# Patient Record
Sex: Male | Born: 1975 | Race: Black or African American | Hispanic: No | Marital: Married | Smoking: Former smoker
Health system: Southern US, Community
[De-identification: ages and names within clinical notes are randomized; demographics above are authoritative.]

## PROBLEM LIST (undated history)

## (undated) DIAGNOSIS — B019 Varicella without complication: Secondary | ICD-10-CM

## (undated) DIAGNOSIS — N451 Epididymitis: Secondary | ICD-10-CM

## (undated) DIAGNOSIS — J309 Allergic rhinitis, unspecified: Secondary | ICD-10-CM

## (undated) DIAGNOSIS — I10 Essential (primary) hypertension: Secondary | ICD-10-CM

## (undated) HISTORY — DX: Varicella without complication: B01.9

## (undated) HISTORY — DX: Allergic rhinitis, unspecified: J30.9

## (undated) HISTORY — DX: Epididymitis: N45.1

---
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
## 2004-03-27 DIAGNOSIS — N451 Epididymitis: Secondary | ICD-10-CM

## 2004-03-27 HISTORY — DX: Epididymitis: N45.1

## 2006-08-27 ENCOUNTER — Ambulatory Visit: Payer: Self-pay | Admitting: Internal Medicine

## 2008-02-19 ENCOUNTER — Emergency Department: Payer: Self-pay | Admitting: Emergency Medicine

## 2009-01-20 ENCOUNTER — Ambulatory Visit: Payer: Self-pay | Admitting: Internal Medicine

## 2010-09-14 ENCOUNTER — Ambulatory Visit: Payer: Self-pay | Admitting: Urology

## 2011-04-17 ENCOUNTER — Emergency Department: Payer: Self-pay | Admitting: Unknown Physician Specialty

## 2011-04-25 ENCOUNTER — Emergency Department: Payer: Self-pay | Admitting: Emergency Medicine

## 2013-03-26 ENCOUNTER — Ambulatory Visit: Payer: Self-pay | Admitting: Adult Health

## 2013-08-15 ENCOUNTER — Ambulatory Visit: Payer: Self-pay | Admitting: Adult Health

## 2013-08-29 ENCOUNTER — Encounter: Payer: Self-pay | Admitting: Adult Health

## 2013-08-29 ENCOUNTER — Encounter (INDEPENDENT_AMBULATORY_CARE_PROVIDER_SITE_OTHER): Payer: Self-pay

## 2013-08-29 ENCOUNTER — Ambulatory Visit (INDEPENDENT_AMBULATORY_CARE_PROVIDER_SITE_OTHER): Payer: Managed Care, Other (non HMO) | Admitting: Adult Health

## 2013-08-29 VITALS — BP 144/80 | HR 80 | Temp 97.9°F | Resp 12 | Ht 68.5 in | Wt 244.8 lb

## 2013-08-29 DIAGNOSIS — R1032 Left lower quadrant pain: Secondary | ICD-10-CM | POA: Insufficient documentation

## 2013-08-29 DIAGNOSIS — Z23 Encounter for immunization: Secondary | ICD-10-CM

## 2013-08-29 DIAGNOSIS — Z0000 Encounter for general adult medical examination without abnormal findings: Secondary | ICD-10-CM | POA: Insufficient documentation

## 2013-08-29 NOTE — Progress Notes (Signed)
Pre visit review using our clinic review tool, if applicable. No additional management support is needed unless otherwise documented below in the visit note. 

## 2013-08-29 NOTE — Assessment & Plan Note (Signed)
Patient given flu vaccine during clinic today

## 2013-08-29 NOTE — Progress Notes (Signed)
Subjective:    Patient ID: Jonathan Oconnor, male    DOB: 1976-02-05, 37 y.o.   MRN: [id number]  HPI  Patient is a pleasant 37 year old male who presents to clinic to establish care. Patient is a poor historian. He reports having transient left groin pain. He describes pain lasting less than 5 minutes. Initially the pain is severe than it becomes dull. He reports he has seen a urologist and has had an ultrasound. Patient does not recall who the urologist is. He reports the urologist office was at [facility] Dr. Achilles Dunk. He also reports having an injury in 2005 when he was moving an engine and a transmission. He reports developing a left swollen testicle where he was seen at [facility]. He is unable to give me additional information other than what is written above. Patient has been recently followed by [facility] medical and prior to that by Dr. Jarold Motto in [place]. I will try to obtain medical records including records from [facility] emergency room between the dates of July 2005 and December 2005. Currently patient is not having pain. He denies having swollen testicles. He denies hernia although his description is consistent with one.    Past Medical History  Diagnosis Date  . Chicken pox      Past Surgical History  Procedure Laterality Date  . Knee surgery Left 1982    surgery for ? infection in his knee     Family History  Problem Relation Age of Onset  . Hypertension Mother   . Cancer Maternal Grandfather     prostate  . Heart disease Maternal Grandfather   . Cancer Paternal Grandfather     prostate  . Stroke Paternal Grandfather   . Diabetes Daughter      History   Social History  . Marital Status: Married    Spouse Name: Tangela    Number of Children: 1  . Years of Education: 12   Occupational History  . [employer] certified accountant   Social History Main Topics  . Smoking status: Former Smoker -- 0.25 packs/day for 10 years    Types: Cigarettes    Quit date: 08/30/2007  .  Smokeless tobacco: Never Used  . Alcohol Use: Yes     Comment: Occasionally   . Drug Use: No  . Sexual Activity: Not on file   Other Topics Concern  . Not on file   Social History Narrative   Jonathan Oconnor grew up in [place]. He is currently living in [place] with his wife [name]), daughter Jonathan Oconnor) and his father-in-law. 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 He enjoys fishing on his spare time.     Review of Systems  Constitutional: Negative.   HENT: Negative.   Eyes: Negative.   Respiratory: Negative.   Cardiovascular: Negative.   Gastrointestinal: Negative.   Endocrine: Negative.   Genitourinary: Negative.  Negative for dysuria, hematuria, scrotal swelling, difficulty urinating and testicular pain.  Musculoskeletal: Negative.   Skin: Negative.   Allergic/Immunologic: Negative.   Neurological: Negative.   Hematological: Negative.   Psychiatric/Behavioral: Negative.        Objective:   Physical Exam  Constitutional: He is oriented to person, place, and time. He appears well-developed and well-nourished. No distress.  HENT:  Head: Normocephalic and atraumatic.  Right Ear: External ear normal.  Left Ear: External ear normal.  Nose: Nose normal.  Mouth/Throat: Oropharynx is clear and moist.  Eyes: Conjunctivae and EOM are normal. Pupils are  equal, round, and reactive to light.  Neck: Normal range of motion. Neck supple. No tracheal deviation present. No thyromegaly present.  Cardiovascular: Normal rate, regular rhythm, normal heart sounds and intact distal pulses.  Exam reveals no gallop and no friction rub.   No murmur heard. Pulmonary/Chest: Effort normal and breath sounds normal. No respiratory distress. He has no wheezes. He has no rales.  Abdominal: Soft. Bowel sounds are normal. He exhibits no distension and no mass. There is no tenderness. There is no rebound and no guarding.  Genitourinary:  No palpable groin masses, lymphadenopathy.    Musculoskeletal: Normal range of motion. He exhibits no edema and no tenderness.  Lymphadenopathy:    He has no cervical adenopathy.  Neurological: He is alert and oriented to person, place, and time. He has normal reflexes. No cranial nerve deficit. Coordination normal.  Skin: Skin is warm and dry.  Psychiatric: He has a normal mood and affect. His behavior is normal. Judgment and thought content normal.    BP 144/80  Pulse 80  Temp(Src) 97.9 F (36.6 C) (Oral)  Resp 12  Ht 5' 8.5" (1.74 m)  Wt 244 lb 12 oz (111.018 kg)  BMI 36.67 kg/m2  SpO2 96%        Assessment & Plan:

## 2013-08-29 NOTE — Assessment & Plan Note (Signed)
Normal physical exam. Check labs: CBC with differential, comprehensive metabolic panel, lipids. Obtain previous medical records and testing pertaining to his reported injury in 2005. Also request previous medical records from previous PCPs.

## 2013-08-29 NOTE — Assessment & Plan Note (Addendum)
Currently not in any pain. No swelling of testicle. I will try to obtain medical records from [facility] visit reported above as well as records from urologist. From patient's description I believe he saw Dr. Achilles Dunk whose office was at [facility] during that time. Once I receive the records we will determine additional testing and followup necessary to evaluate ongoing transient pain. Note greater than 45 minutes were spent in face-to-face communication with patient in trying to obtain an accurate H&P, assessment, evaluation, implementation and plan pertaining to the problem.

## 2013-08-29 NOTE — Patient Instructions (Signed)
   Thank you for choosing [place] [facility] for your health care needs.  Please have your labs drawn at your earliest convenience. You will need to be fasting - nothing to eat or drink (except water) after midnight the night before.  The results will be available through MyChart for your convenience. Please remember to activate this. The activation code is located at the end of this form.  I am requesting medical records from your previous providers so that I can get a better understanding of the testing and diagnosis regarding your left groin pain.

## 2013-09-03 ENCOUNTER — Other Ambulatory Visit: Payer: Managed Care, Other (non HMO)

## 2013-09-05 ENCOUNTER — Other Ambulatory Visit (INDEPENDENT_AMBULATORY_CARE_PROVIDER_SITE_OTHER): Payer: Managed Care, Other (non HMO)

## 2013-09-05 ENCOUNTER — Encounter: Payer: Self-pay | Admitting: *Deleted

## 2013-09-05 DIAGNOSIS — Z0000 Encounter for general adult medical examination without abnormal findings: Secondary | ICD-10-CM

## 2013-09-05 LAB — COMPREHENSIVE METABOLIC PANEL
ALK PHOS: 74 U/L (ref 39–117)
ALT: 28 U/L (ref 0–53)
AST: 23 U/L (ref 0–37)
Albumin: 4 g/dL (ref 3.5–5.2)
BILIRUBIN TOTAL: 1.2 mg/dL (ref 0.3–1.2)
BUN: 18 mg/dL (ref 6–23)
CO2: 28 mEq/L (ref 19–32)
Calcium: 8.6 mg/dL (ref 8.4–10.5)
Chloride: 107 mEq/L (ref 96–112)
Creatinine, Ser: 0.8 mg/dL (ref 0.4–1.5)
GFR: 140.75 mL/min (ref >60.00)
GLUCOSE: 101 mg/dL — AB (ref 70–99)
Potassium: 3.9 mEq/L (ref 3.5–5.1)
Sodium: 140 mEq/L (ref 135–145)
Total Protein: 7 g/dL (ref 6.0–8.3)

## 2013-09-05 LAB — CBC WITH DIFFERENTIAL/PLATELET
BASOS PCT: 0.7 % (ref 0.0–3.0)
Basophils Absolute: 0 10*3/uL (ref 0.0–0.1)
Eosinophils Absolute: 0.1 10*3/uL (ref 0.0–0.7)
Eosinophils Relative: 2.5 % (ref 0.0–5.0)
HCT: 41.1 % (ref 39.0–52.0)
HEMOGLOBIN: 14 g/dL (ref 13.0–17.0)
Lymphocytes Relative: 39 % (ref 12.0–46.0)
Lymphs Abs: 1.8 10*3/uL (ref 0.7–4.0)
MCHC: 34 g/dL (ref 30.0–36.0)
MCV: 83.8 fl (ref 78.0–100.0)
MONOS PCT: 7.9 % (ref 3.0–12.0)
Monocytes Absolute: 0.4 10*3/uL (ref 0.1–1.0)
NEUTROS ABS: 2.3 10*3/uL (ref 1.4–7.7)
Neutrophils Relative %: 49.9 % (ref 43.0–77.0)
Platelets: 153 10*3/uL (ref 150.0–400.0)
RBC: 4.9 Mil/uL (ref 4.22–5.81)
RDW: 13.3 % (ref 11.5–14.6)
WBC: 4.5 10*3/uL (ref 4.5–10.5)

## 2013-09-05 LAB — LIPID PANEL
CHOL/HDL RATIO: 3
Cholesterol: 178 mg/dL (ref 0–200)
HDL: 52.1 mg/dL (ref >39.00)
LDL CALC: 110 mg/dL — AB (ref 0–99)
Triglycerides: 79 mg/dL (ref 0.0–149.0)
VLDL: 15.8 mg/dL (ref 0.0–40.0)

## 2013-09-05 LAB — TSH: TSH: 0.72 u[IU]/mL (ref 0.35–5.50)

## 2013-09-07 ENCOUNTER — Encounter: Payer: Self-pay | Admitting: Adult Health

## 2013-09-22 ENCOUNTER — Telehealth: Payer: Self-pay | Admitting: Emergency Medicine

## 2013-09-22 ENCOUNTER — Other Ambulatory Visit: Payer: Self-pay | Admitting: Adult Health

## 2013-09-22 NOTE — Telephone Encounter (Signed)
Dr. Wynn Maudlinope's notes received today, given to Raquel

## 2013-09-22 NOTE — Telephone Encounter (Signed)
Pt wife in office wondering about a MRI?? Please advise

## 2013-09-22 NOTE — Telephone Encounter (Signed)
Spoke with pt's wife, was following up from his first visit appointment, states Raquel was going to review his records and them send him for additional imaging for his groin pain.

## 2013-09-24 ENCOUNTER — Other Ambulatory Visit: Payer: Self-pay | Admitting: Adult Health

## 2013-09-24 DIAGNOSIS — R1032 Left lower quadrant pain: Secondary | ICD-10-CM

## 2013-09-24 NOTE — Telephone Encounter (Signed)
Order a CT scan to evaluate the area.

## 2013-10-01 ENCOUNTER — Ambulatory Visit: Payer: Self-pay | Admitting: Adult Health

## 2013-10-13 ENCOUNTER — Telehealth: Payer: Self-pay | Admitting: Adult Health

## 2013-10-13 NOTE — Telephone Encounter (Signed)
The patient's wife stated that the patient was to be referred to a surgeon for his two hernia's. I don't see a referral in the system for a surgeon.

## 2013-10-13 NOTE — Telephone Encounter (Signed)
Left message, notifying that Jonathan Oconnor was waiting to hear back if ok with referral and if he has a preference.

## 2013-10-17 ENCOUNTER — Other Ambulatory Visit: Payer: Self-pay | Admitting: Adult Health

## 2013-10-17 DIAGNOSIS — K4090 Unilateral inguinal hernia, without obstruction or gangrene, not specified as recurrent: Secondary | ICD-10-CM

## 2013-10-17 NOTE — Telephone Encounter (Signed)
Referral to Dr. Evette CristalSankar made

## 2013-10-17 NOTE — Telephone Encounter (Signed)
Spoke with pt's wife, pt agreeable to surgery referral, prefers Dr. Evette CristalSankar. Advised once referral placed and an appointment scheduled, our patient care coordinator would give him a call with an appointment.

## 2013-10-22 ENCOUNTER — Ambulatory Visit: Payer: Self-pay | Admitting: General Surgery

## 2013-10-28 ENCOUNTER — Encounter: Payer: Self-pay | Admitting: Adult Health

## 2013-11-17 ENCOUNTER — Ambulatory Visit: Payer: Self-pay | Admitting: General Surgery

## 2013-12-03 ENCOUNTER — Encounter: Payer: Self-pay | Admitting: *Deleted

## 2016-04-07 ENCOUNTER — Ambulatory Visit
Admission: RE | Admit: 2016-04-07 | Discharge: 2016-04-07 | Disposition: A | Discharge status: Home / Self Care | Payer: Managed Care, Other (non HMO) | Source: Ambulatory Visit | Attending: Physician Assistant | Admitting: Physician Assistant

## 2016-04-07 ENCOUNTER — Other Ambulatory Visit: Payer: Self-pay | Admitting: Physician Assistant

## 2016-04-07 DIAGNOSIS — M25511 Pain in right shoulder: Secondary | ICD-10-CM | POA: Diagnosis not present

## 2016-04-07 DIAGNOSIS — R52 Pain, unspecified: Secondary | ICD-10-CM

## 2017-04-30 ENCOUNTER — Emergency Department
Admission: EM | Admit: 2017-04-30 | Discharge: 2017-04-30 | Disposition: A | Discharge status: Home / Self Care | Payer: Commercial Managed Care - PPO | Attending: Emergency Medicine | Admitting: Emergency Medicine

## 2017-04-30 DIAGNOSIS — F17210 Nicotine dependence, cigarettes, uncomplicated: Secondary | ICD-10-CM | POA: Diagnosis not present

## 2017-04-30 DIAGNOSIS — I10 Essential (primary) hypertension: Secondary | ICD-10-CM | POA: Diagnosis present

## 2017-04-30 DIAGNOSIS — R519 Headache, unspecified: Secondary | ICD-10-CM

## 2017-04-30 DIAGNOSIS — E876 Hypokalemia: Secondary | ICD-10-CM | POA: Insufficient documentation

## 2017-04-30 DIAGNOSIS — R51 Headache: Secondary | ICD-10-CM | POA: Insufficient documentation

## 2017-04-30 HISTORY — DX: Essential (primary) hypertension: I10

## 2017-04-30 LAB — CBC
HEMATOCRIT: 41.6 % (ref 40.0–52.0)
Hemoglobin: 14.6 g/dL (ref 13.0–18.0)
MCH: 28.5 pg (ref 26.0–34.0)
MCHC: 35.2 g/dL (ref 32.0–36.0)
MCV: 81.1 fL (ref 80.0–100.0)
Platelets: 177 10*3/uL (ref 150–440)
RBC: 5.12 MIL/uL (ref 4.40–5.90)
RDW: 13.7 % (ref 11.5–14.5)
WBC: 5.6 10*3/uL (ref 3.8–10.6)

## 2017-04-30 LAB — TROPONIN I: Troponin I: <0.03 ng/mL (ref <0.03)

## 2017-04-30 LAB — BASIC METABOLIC PANEL
ANION GAP: 11 (ref 5–15)
BUN: 17 mg/dL (ref 6–20)
CALCIUM: 9.6 mg/dL (ref 8.9–10.3)
CO2: 26 mmol/L (ref 22–32)
Chloride: 99 mmol/L — ABNORMAL LOW (ref 101–111)
Creatinine, Ser: 1.12 mg/dL (ref 0.61–1.24)
GLUCOSE: 159 mg/dL — AB (ref 65–99)
POTASSIUM: 3 mmol/L — AB (ref 3.5–5.1)
SODIUM: 136 mmol/L (ref 135–145)

## 2017-04-30 MED ORDER — POTASSIUM CHLORIDE CRYS ER 20 MEQ PO TBCR
40.0000 meq | EXTENDED_RELEASE_TABLET | Freq: Once | ORAL | Status: AC
  Administered 2017-04-30: 40 meq via ORAL
  Filled 2017-04-30: qty 2

## 2017-04-30 MED ORDER — POTASSIUM CHLORIDE CRYS ER 20 MEQ PO TBCR: 20.0000 meq | EXTENDED_RELEASE_TABLET | Freq: Every day | ORAL | 0 refills | Status: DC

## 2017-04-30 MED ORDER — AMLODIPINE BESYLATE 5 MG PO TABS: 5.0000 mg | ORAL_TABLET | Freq: Every day | ORAL | 1 refills | Status: DC

## 2017-04-30 NOTE — ED Provider Notes (Signed)
[facility] Emergency Department Provider Note  ____________________________________________   First MD Initiated Contact with Patient 04/30/17 2147     (approximate)  I have reviewed the triage vital signs and the nursing notes.   HISTORY  Chief Complaint Headache and Hypertension    HPI Jonathan Oconnor is a 41 y.o. male with a history of obesity and hypertension who presents for evaluation of persistent headache for about 3 or 4 days and elevated blood pressure.  He admits that he does not take his blood pressure medication regularly, only "when I think about it."  He reports he is less likely to take it when he is stressed and busy at work, which has definitely been the case recently, but he knows that is also when his blood pressure is higher.  he reports he may have had some mild blurry vision but is not certain.  He became a little bit more concerned yesterday when his headache was the most severe and he also had what he thought was a little bit of left-sided chest pain that was very brief and has completely resolved.  His wife finally convinced him to come to the emergency department today.  He denies fever/chills, difficulty breathing, abdominal pain, nausea, vomiting, constipation, diarrhea, and denies any numbness or weakness in any of his extremities.  he describes his headache is a dull throbbing throughout his whole head.  Light may make it a little bit worse but he has not had any aura around lights and has not had any nausea.  He has no history of migraines. He has not had any recent trauma.   Past Medical History:  Diagnosis Date  . Chicken pox   . Epididymitis 03/27/04   ED visit [facility]  . Hypertension     Patient Active Problem List   Diagnosis Date Noted  . Routine general medical examination at a health care facility 08/29/2013  . Left groin pain 08/29/2013  . Need for prophylactic vaccination and inoculation against influenza  08/29/2013    Past Surgical History:  Procedure Laterality Date  . KNEE SURGERY Left 1982   surgery for ? infection in his knee    Prior to Admission medications   Medication Sig Start Date End Date Taking? Authorizing Provider  amLODipine (NORVASC) 5 MG tablet Take 1 tablet (5 mg total) by mouth daily. 04/30/17 04/30/18  Loleta Rose, MD  potassium chloride SA (KLOR-CON M20) 20 MEQ tablet Take 1 tablet (20 mEq total) by mouth daily. 04/30/17   Loleta Rose, MD    Allergies Patient has no known allergies.  Family History  Problem Relation Age of Onset  . Hypertension Mother   . Cancer Maternal Grandfather        prostate  . Heart disease Maternal Grandfather   . Cancer Paternal Grandfather        prostate  . Stroke Paternal Grandfather   . Diabetes Daughter     Social History Social History  Substance Use Topics  . Smoking status: Current Some Day Smoker    Packs/day: 0.25    Years: 10.00    Types: Cigarettes    Last attempt to quit: 08/30/2007  . Smokeless tobacco: Never Used  . Alcohol use Yes     Comment: Occasionally     Review of Systems Constitutional: No fever/chills Eyes: possible blurry vision yesterday ENT: No sore throat. Cardiovascular: one episode of chest pain yesterday Respiratory: Denies shortness of breath. Gastrointestinal: No abdominal pain.  No nausea,  no vomiting.  No diarrhea.  No constipation. Genitourinary: Negative for dysuria. Musculoskeletal: Negative for neck pain.  Negative for back pain. Integumentary: Negative for rash. Neurological: persistent dull throbbing headache for several days.  No numbness nor weakness in his extremities  ____________________________________________   PHYSICAL EXAM:  VITAL SIGNS: ED Triage Vitals [04/30/17 1912]  Enc Vitals Group     BP (!) 171/107     Pulse Rate 97     Resp 16     Temp 98.5 F (36.9 C)     Temp Source Oral     SpO2 97 %     Weight 120.2 kg (265 lb)     Height 1.753 m (5'  9")     Head Circumference      Peak Flow      Pain Score 7     Pain Loc      Pain Edu?      Excl. in GC?     Constitutional: Alert and oriented. Well appearing and in no acute distress. Eyes: Conjunctivae are normal. PERRL.  No papilledema on funduscopic exam Head: Atraumatic. Nose: No congestion/rhinnorhea. Mouth/Throat: Mucous membranes are moist. Neck: No stridor.  No meningeal signs.   Cardiovascular: Normal rate, regular rhythm. Good peripheral circulation. Grossly normal heart sounds. Respiratory: Normal respiratory effort.  No retractions. Lungs CTAB. Gastrointestinal: obese. Soft and nontender. No distention.  Musculoskeletal: No lower extremity tenderness nor edema. No gross deformities of extremities. Neurologic:  Normal speech and language. No gross focal neurologic deficits are appreciated.  Skin:  Skin is warm, dry and intact. No rash noted. Psychiatric: Mood and affect are normal. Speech and behavior are normal.  ____________________________________________   LABS (all labs ordered are listed, but only abnormal results are displayed)  Labs Reviewed  BASIC METABOLIC PANEL - Abnormal; Notable for the following:       Result Value   Potassium 3.0 (*)    Chloride 99 (*)    Glucose, Bld 159 (*)    All other components within normal limits  CBC  TROPONIN I   ____________________________________________  EKG  ED ECG REPORT I, Adriyana Greenbaum, the attending physician, personally viewed and interpreted this ECG.  Date: 04/30/2017 EKG Time: 19:24 Rate: 93 Rhythm: Incomplete right bundle-branch block QRS Axis: normal Intervals: normal ST/T Wave abnormalities: Non-specific ST segment / T-wave changes, but no evidence of acute ischemia. Narrative Interpretation: no evidence of acute ischemia  ____________________________________________  RADIOLOGY   No results found.  ____________________________________________   PROCEDURES  Critical Care performed:  No   Procedure(s) performed:   Procedures   ____________________________________________   INITIAL IMPRESSION / ASSESSMENT AND PLAN / ED COURSE  Pertinent labs & imaging results that were available during my care of the patient were reviewed by me and considered in my medical decision making (see chart for details).  I had a long conversation with the patient and his wife about acute versus chronic hypertension, size and symptoms of hypertensive urgency, etc.  We discussed various options and we settled on the following plan: He promises to take his HCTZ daily and they will work together on a healthy eating plan including reducing sodium. He will check his blood pressure again in about a week and see if it is improved.  If not, he will start takig the amlodipine are prescribed in addition to his HCTZ.  He is also going to take potassium supplement given that his potassium was low in spite of intermittent doses ofHCTZ.  All of  the usual intracranial causes of headache were considered but I do not believe he has any acute or emergent cause at this time.  I think he may mostly be suffering from tension headachesbecausehe mentioned multiple times the stress and pressure at work, but his blood pressure may be contributing  I encouraged him to follow up with his regular doctor at the next available opportunity.  I gave my usual and customary return precautions.   He and his wife agree with the plan.      ____________________________________________  FINAL CLINICAL IMPRESSION(S) / ED DIAGNOSES  Final diagnoses:  Essential hypertension  Hypokalemia  Acute nonintractable headache, unspecified headache type     MEDICATIONS GIVEN DURING THIS VISIT:  Medications  potassium chloride SA (K-DUR,KLOR-CON) CR tablet 40 mEq (40 mEq Oral Given 04/30/17 2256)     NEW OUTPATIENT MEDICATIONS STARTED DURING THIS VISIT:  Discharge Medication List as of 04/30/2017 10:46 PM    START taking these  medications   Details  amLODipine (NORVASC) 5 MG tablet Take 1 tablet (5 mg total) by mouth daily., Starting Mon 04/30/2017, Until Tue 04/30/2018, Print    potassium chloride SA (KLOR-CON M20) 20 MEQ tablet Take 1 tablet (20 mEq total) by mouth daily., Starting Mon 04/30/2017, Print        Discharge Medication List as of 04/30/2017 10:46 PM      Discharge Medication List as of 04/30/2017 10:46 PM       Note:  This document was prepared using Dragon voice recognition software and may include unintentional dictation errors.    Loleta Rose, MD 04/30/17 [phone]

## 2017-04-30 NOTE — Discharge Instructions (Signed)
As we discussed, though you do have high blood pressure (hypertension), fortunately it is not immediately dangerous at this time and does not need emergency intervention or admission to the hospital.  We recommend you take your normal blood pressure medication as written on the bottle, and read through the information in this paperwork about the DASH eating plan.  Check your blood pressure again in about a week, and if your pressure is still elevated in spite of making dietary changes and taking your medication, try starting the new medication I prescribed (amlodipine) IN ADDITION to your regular HCTZ.    Because your potassium level is low today, and HCTZ can drop potassium levels even farther, we recommend you take an over-the-counter potassium supplement, or take the one I prescribed.  Follow up with your primary care doctor at the next available opportunity to discuss your symptoms, the changes we recommended, and how you are feeling at the time.  Return to the emergency department if you develop new or worsening symptoms that concern you.

## 2017-04-30 NOTE — ED Triage Notes (Signed)
Pt presents via POV c/o headache and HTN since Friday. Pt reports hx HTN but noncompliant with medication regimen. Pt In NAD in triage. Pt reports some chest pain earlier but denies currently.

## 2018-06-21 IMAGING — CR DG SHOULDER 2+V*R*
1 series · 3 of 3 positions shown · non-contrast
Comparison: None.

CLINICAL DATA: Patient with right shoulder pain for multiple
months. No known injury

EXAM:
RIGHT SHOULDER - 2+ VIEW

[Series 1: dg shoulder right · 0.14mm/px · 3 of 3 slices shown]
[im 1/3]
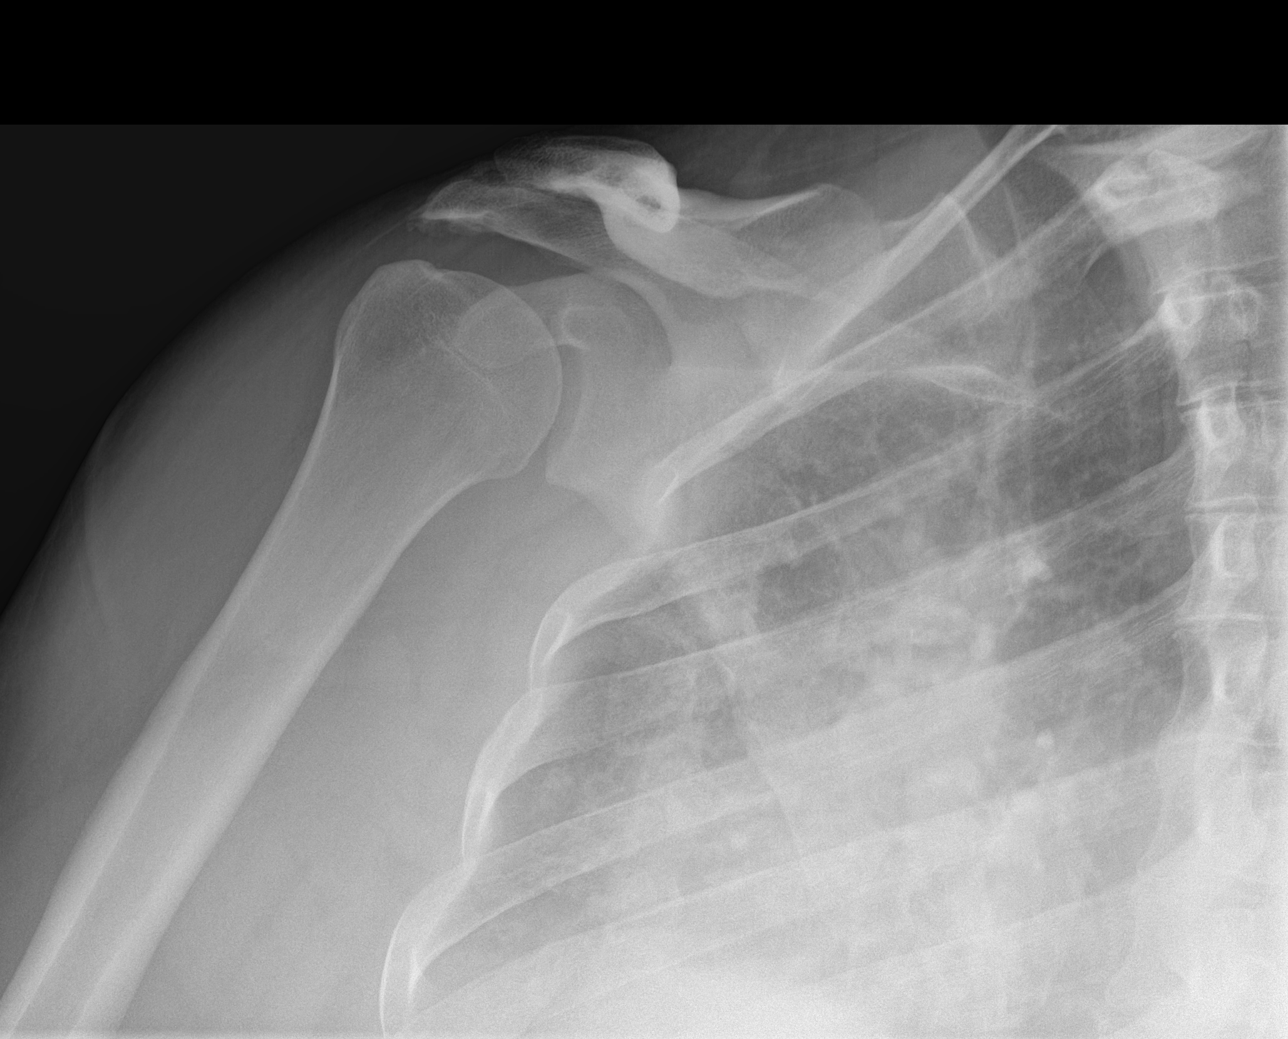
[im 2/3]
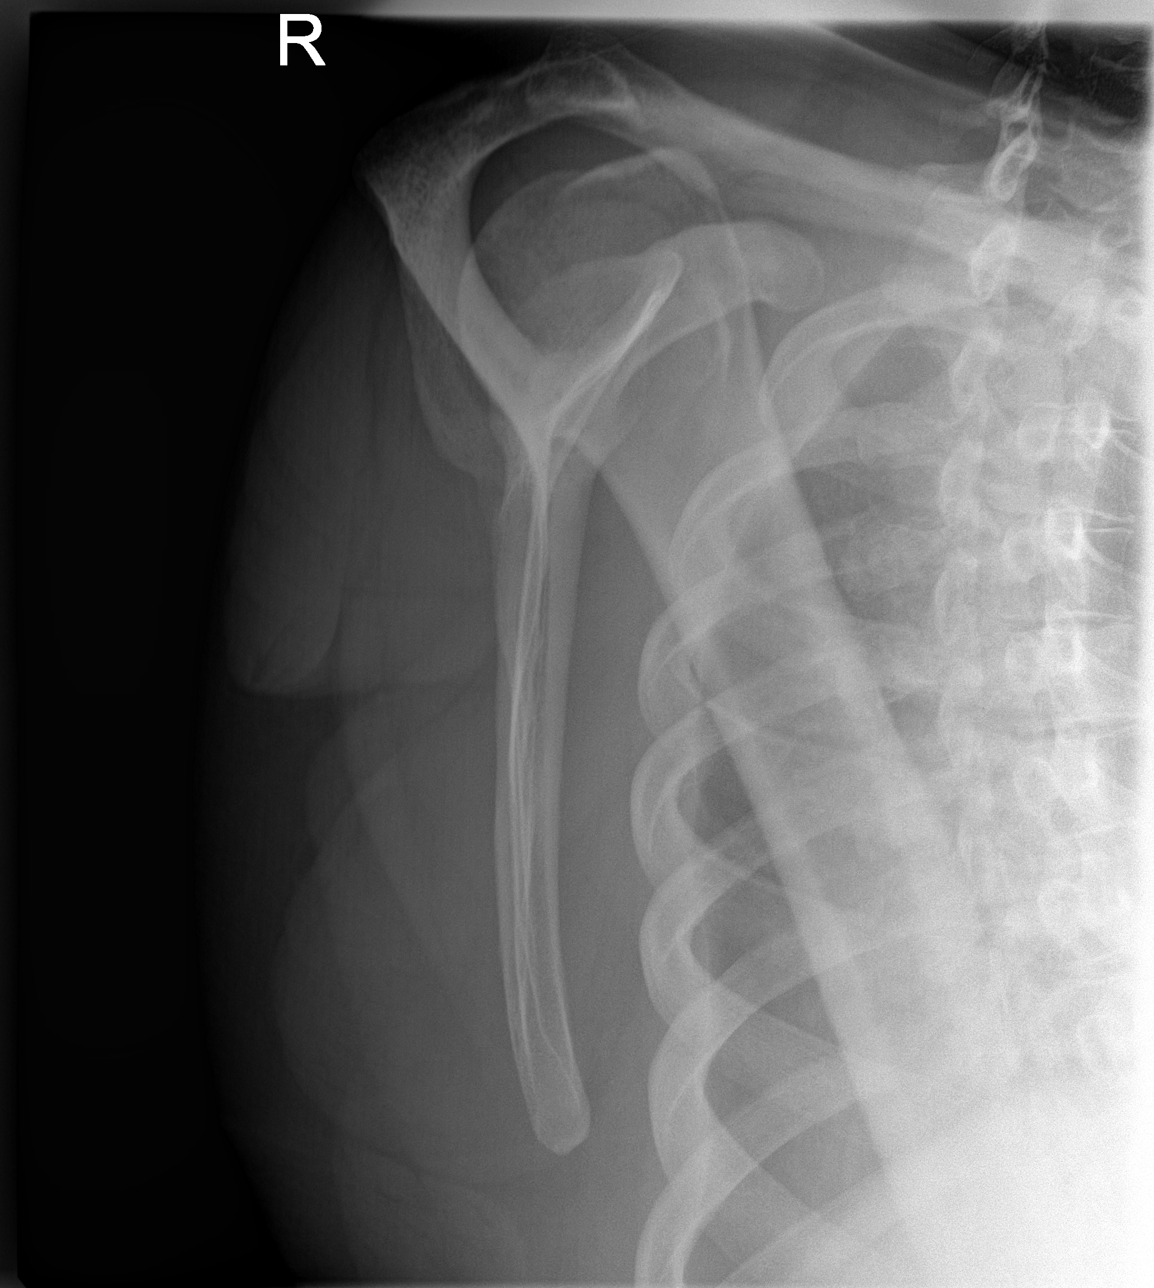
[im 3/3]
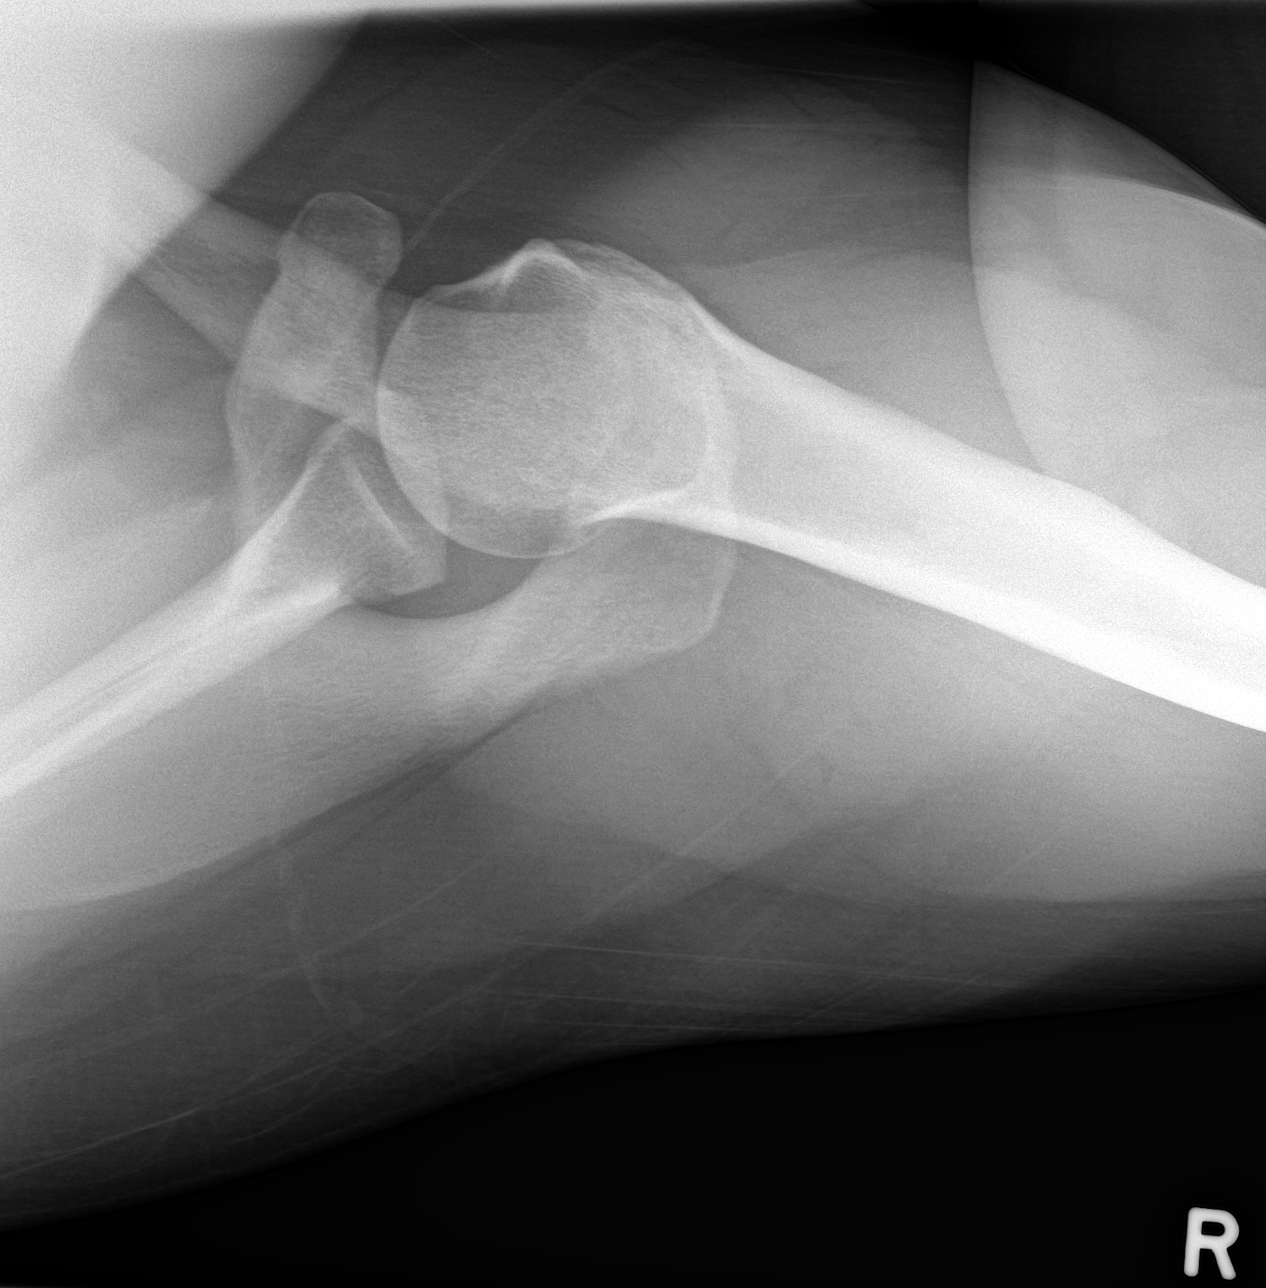

[3 of 3 positions shown; findings below may reference images not displayed]

FINDINGS: Normal anatomic alignment. No evidence for acute fracture or
dislocation. Right hemi thorax is unremarkable.
IMPRESSION: No acute osseous abnormality.

## 2018-07-01 ENCOUNTER — Telehealth: Payer: Self-pay | Admitting: Physician Assistant

## 2018-07-01 NOTE — Telephone Encounter (Signed)
Called to confirm new patient appt on 10/29 and pt needs to reschedule appt. Is there anyway the pt can get back on the schedule in November or December? Or does the pt need to wait until March?

## 2018-07-02 ENCOUNTER — Ambulatory Visit: Payer: Commercial Managed Care - PPO | Admitting: Internal Medicine

## 2018-07-02 NOTE — Telephone Encounter (Signed)
Patient rescheduled appointment to 07/19/18 at 9:30.

## 2018-07-02 NOTE — Telephone Encounter (Signed)
Go ahead and find him something sooner

## 2018-07-19 ENCOUNTER — Ambulatory Visit (INDEPENDENT_AMBULATORY_CARE_PROVIDER_SITE_OTHER): Payer: 59 | Admitting: Internal Medicine

## 2018-07-19 ENCOUNTER — Encounter: Payer: Self-pay | Admitting: Internal Medicine

## 2018-07-19 VITALS — BP 154/96 | HR 68 | Temp 97.8°F | Ht 68.0 in | Wt 248.0 lb

## 2018-07-19 DIAGNOSIS — Z0000 Encounter for general adult medical examination without abnormal findings: Secondary | ICD-10-CM

## 2018-07-19 DIAGNOSIS — Z23 Encounter for immunization: Secondary | ICD-10-CM

## 2018-07-19 DIAGNOSIS — I10 Essential (primary) hypertension: Secondary | ICD-10-CM | POA: Diagnosis not present

## 2018-07-19 MED ORDER — VALSARTAN-HYDROCHLOROTHIAZIDE 160-25 MG PO TABS: 1.0000 | ORAL_TABLET | Freq: Every day | ORAL | 3 refills | Status: DC

## 2018-07-19 NOTE — Progress Notes (Signed)
Subjective:    Patient ID: Jonathan Oconnor, male    DOB: 03/01/1976, 42 y.o.   MRN: [id number]  HPI Here to establish care Had switched to [place] --was unsatisfied  Known HTN Goes back about 2 years ago Was on HCTZ and it got better Wound up with a lot of stress at job at [employer] to ER and was put on amlodipine He ran out of this Found the HCTZ better  Goes to holistic trainer with wife Works on [employer] to gym  Has some pain in his hands at times Usually after work but not always More in wrists and down forearms No symptoms in bed Discussed early carpal tunnel  Current Outpatient Medications on File Prior to Visit  Medication Sig Dispense Refill  . potassium chloride SA (KLOR-CON M20) 20 MEQ tablet Take 1 tablet (20 mEq total) by mouth daily. 14 tablet 0  . amLODipine (NORVASC) 5 MG tablet Take 1 tablet (5 mg total) by mouth daily. 30 tablet 1   No current facility-administered medications on file prior to visit.     Allergies  Allergen Reactions  . Avocado Other (See Comments)    Sore throat  . Other Nausea And Vomiting    Melons    Past Medical History:  Diagnosis Date  . Chicken pox   . Epididymitis 03/27/04   ED visit [facility]  . Hypertension     Past Surgical History:  Procedure Laterality Date  . KNEE SURGERY Left 1982   surgery for ? infection in his knee    Family History  Problem Relation Age of Onset  . Hypertension Mother   . Diabetes Mother        prediabetes  . Cancer Maternal Grandfather        prostate  . Heart disease Maternal Grandfather   . Cancer Paternal Grandfather        prostate  . Stroke Paternal Grandfather   . Prostate cancer Father     Social History   Socioeconomic History  . Marital status: Married    Spouse name: Tangela  . Number of children: 2  . Years of education: 24  . Highest education level: Not on file  Occupational History  . Occupation: Glass blower/designer: [employer]    Comment: Technical brewer  Social Needs  . Financial resource strain: Not on file  . Food insecurity:    Worry: Not on file    Inability: Not on file  . Transportation needs:    Medical: Not on file    Non-medical: Not on file  Tobacco Use  . Smoking status: Former Smoker    Packs/day: 0.25    Years: 10.00    Pack years: 2.50    Types: Cigarettes    Last attempt to quit: 2018    Years since quitting: 1.8  . Smokeless tobacco: Never Used  Substance and Sexual Activity  . Alcohol use: Yes    Comment: Occasionally   . Drug use: No  . Sexual activity: Not on file  Lifestyle  . Physical activity:    Days per week: Not on file    Minutes per session: Not on file  . Stress: Not on file  Relationships  . Social connections:    Talks on phone: Not on file    Gets together: Not on file    Attends religious service: Not on file    Active member of club or organization: Not on file  Attends meetings of clubs or organizations: Not on file    Relationship status: Not on file  . Intimate partner violence:    Fear of current or ex partner: Not on file    Emotionally abused: Not on file    Physically abused: Not on file    Forced sexual activity: Not on file  Other Topics Concern  . Not on file  Social History Narrative   Minerva Areolaric grew up in [place]. He is currently living in [place] with his wife [name](Tangela), daughter [name](Qiara) and his father-in-law. Step daughter --with his grandchild. He enjoys fishing on his spare time.    Review of Systems  Constitutional: Negative for fatigue and unexpected weight change.       Wears seat belt Occ smokes CBD leaf---counseled  HENT: Positive for tinnitus. Negative for dental problem, hearing loss and trouble swallowing.        Keeps up with dentist  Eyes: Negative for visual disturbance.       No diplopia or unilateral vision loss  Respiratory: Negative for cough, chest tightness and shortness of breath.   Cardiovascular: Positive for  palpitations. Negative for chest pain and leg swelling.  Gastrointestinal: Negative for blood in stool and constipation.       Occ heartburn-- no meds  Endocrine: Negative for polydipsia and polyuria.  Genitourinary: Negative for difficulty urinating and urgency.       No sexual problems  Musculoskeletal: Positive for back pain. Negative for arthralgias and joint swelling.       Sees chiropractor regularly  Allergic/Immunologic: Positive for environmental allergies. Negative for immunocompromised state.       Zyrtec and flonase at times  Neurological: Positive for headaches. Negative for syncope.       Some dizziness--- ?inner ear  Hematological: Negative for adenopathy. Does not bruise/bleed easily.  Psychiatric/Behavioral: Negative for dysphoric mood and sleep disturbance. The patient is not nervous/anxious.        Objective:   Physical Exam         Assessment & Plan:

## 2018-07-19 NOTE — Patient Instructions (Signed)
DASH Eating Plan DASH stands for "Dietary Approaches to Stop Hypertension." The DASH eating plan is a healthy eating plan that has been shown to reduce high blood pressure (hypertension). It may also reduce your risk for type 2 diabetes, heart disease, and stroke. The DASH eating plan may also help with weight loss. What are tips for following this plan? General guidelines  Avoid eating more than 2,300 mg (milligrams) of salt (sodium) a day. If you have hypertension, you may need to reduce your sodium intake to 1,500 mg a day.  Limit alcohol intake to no more than 1 drink a day for nonpregnant women and 2 drinks a day for men. One drink equals 12 oz of beer, 5 oz of wine, or 1 oz of hard liquor.  Work with your health care provider to maintain a healthy body weight or to lose weight. Ask what an ideal weight is for you.  Get at least 30 minutes of exercise that causes your heart to beat faster (aerobic exercise) most days of the week. Activities may include walking, swimming, or biking.  Work with your health care provider or diet and nutrition specialist (dietitian) to adjust your eating plan to your individual calorie needs. Reading food labels  Check food labels for the amount of sodium per serving. Choose foods with less than 5 percent of the Daily Value of sodium. Generally, foods with less than 300 mg of sodium per serving fit into this eating plan.  To find whole grains, look for the word "whole" as the first word in the ingredient list. Shopping  Buy products labeled as "low-sodium" or "no salt added."  Buy fresh foods. Avoid canned foods and premade or frozen meals. Cooking  Avoid adding salt when cooking. Use salt-free seasonings or herbs instead of table salt or sea salt. Check with your health care provider or pharmacist before using salt substitutes.  Do not fry foods. Cook foods using healthy methods such as baking, boiling, grilling, and broiling instead.  Cook with  heart-healthy oils, such as olive, canola, soybean, or sunflower oil. Meal planning   Eat a balanced diet that includes: ? 5 or more servings of fruits and vegetables each day. At each meal, try to fill half of your plate with fruits and vegetables. ? Up to 6-8 servings of whole grains each day. ? Less than 6 oz of lean meat, poultry, or fish each day. A 3-oz serving of meat is about the same size as a deck of cards. One egg equals 1 oz. ? 2 servings of low-fat dairy each day. ? A serving of nuts, seeds, or beans 5 times each week. ? Heart-healthy fats. Healthy fats called Omega-3 fatty acids are found in foods such as flaxseeds and coldwater fish, like sardines, salmon, and mackerel.  Limit how much you eat of the following: ? Canned or prepackaged foods. ? Food that is high in trans fat, such as fried foods. ? Food that is high in saturated fat, such as fatty meat. ? Sweets, desserts, sugary drinks, and other foods with added sugar. ? Full-fat dairy products.  Do not salt foods before eating.  Try to eat at least 2 vegetarian meals each week.  Eat more home-cooked food and less restaurant, buffet, and fast food.  When eating at a restaurant, ask that your food be prepared with less salt or no salt, if possible. What foods are recommended? The items listed may not be a complete list. Talk with your dietitian about what   dietary choices are best for you. Grains Whole-grain or whole-wheat bread. Whole-grain or whole-wheat pasta. Brown rice. Oatmeal. Quinoa. Bulgur. Whole-grain and low-sodium cereals. Pita bread. Low-fat, low-sodium crackers. Whole-wheat flour tortillas. Vegetables Fresh or frozen vegetables (raw, steamed, roasted, or grilled). Low-sodium or reduced-sodium tomato and vegetable juice. Low-sodium or reduced-sodium tomato sauce and tomato paste. Low-sodium or reduced-sodium canned vegetables. Fruits All fresh, dried, or frozen fruit. Canned fruit in natural juice (without  added sugar). Meat and other protein foods Skinless chicken or turkey. Ground chicken or turkey. Pork with fat trimmed off. Fish and seafood. Egg whites. Dried beans, peas, or lentils. Unsalted nuts, nut butters, and seeds. Unsalted canned beans. Lean cuts of beef with fat trimmed off. Low-sodium, lean deli meat. Dairy Low-fat (1%) or fat-free (skim) milk. Fat-free, low-fat, or reduced-fat cheeses. Nonfat, low-sodium ricotta or cottage cheese. Low-fat or nonfat yogurt. Low-fat, low-sodium cheese. Fats and oils Soft margarine without trans fats. Vegetable oil. Low-fat, reduced-fat, or light mayonnaise and salad dressings (reduced-sodium). Canola, safflower, olive, soybean, and sunflower oils. Avocado. Seasoning and other foods Herbs. Spices. Seasoning mixes without salt. Unsalted popcorn and pretzels. Fat-free sweets. What foods are not recommended? The items listed may not be a complete list. Talk with your dietitian about what dietary choices are best for you. Grains Baked goods made with fat, such as croissants, muffins, or some breads. Dry pasta or rice meal packs. Vegetables Creamed or fried vegetables. Vegetables in a cheese sauce. Regular canned vegetables (not low-sodium or reduced-sodium). Regular canned tomato sauce and paste (not low-sodium or reduced-sodium). Regular tomato and vegetable juice (not low-sodium or reduced-sodium). Pickles. Olives. Fruits Canned fruit in a light or heavy syrup. Fried fruit. Fruit in cream or butter sauce. Meat and other protein foods Fatty cuts of meat. Ribs. Fried meat. Bacon. Sausage. Bologna and other processed lunch meats. Salami. Fatback. Hotdogs. Bratwurst. Salted nuts and seeds. Canned beans with added salt. Canned or smoked fish. Whole eggs or egg yolks. Chicken or turkey with skin. Dairy Whole or 2% milk, cream, and half-and-half. Whole or full-fat cream cheese. Whole-fat or sweetened yogurt. Full-fat cheese. Nondairy creamers. Whipped toppings.  Processed cheese and cheese spreads. Fats and oils Butter. Stick margarine. Lard. Shortening. Ghee. Bacon fat. Tropical oils, such as coconut, palm kernel, or palm oil. Seasoning and other foods Salted popcorn and pretzels. Onion salt, garlic salt, seasoned salt, table salt, and sea salt. Worcestershire sauce. Tartar sauce. Barbecue sauce. Teriyaki sauce. Soy sauce, including reduced-sodium. Steak sauce. Canned and packaged gravies. Fish sauce. Oyster sauce. Cocktail sauce. Horseradish that you find on the shelf. Ketchup. Mustard. Meat flavorings and tenderizers. Bouillon cubes. Hot sauce and Tabasco sauce. Premade or packaged marinades. Premade or packaged taco seasonings. Relishes. Regular salad dressings. Where to find more information:  National Heart, Lung, and Blood Institute: www.nhlbi.nih.gov  American Heart Association: www.heart.org Summary  The DASH eating plan is a healthy eating plan that has been shown to reduce high blood pressure (hypertension). It may also reduce your risk for type 2 diabetes, heart disease, and stroke.  With the DASH eating plan, you should limit salt (sodium) intake to 2,300 mg a day. If you have hypertension, you may need to reduce your sodium intake to 1,500 mg a day.  When on the DASH eating plan, aim to eat more fresh fruits and vegetables, whole grains, lean proteins, low-fat dairy, and heart-healthy fats.  Work with your health care provider or diet and nutrition specialist (dietitian) to adjust your eating plan to your individual   calorie needs. This information is not intended to replace advice given to you by your health care provider. Make sure you discuss any questions you have with your health care provider. Document Released: 08/10/2011 Document Revised: 08/14/2016 Document Reviewed: 08/14/2016 Elsevier Interactive Patient Education  2018 Elsevier Inc.  

## 2018-07-19 NOTE — Assessment & Plan Note (Signed)
Did well with HCTZ--but will restart as combo agent Recheck 1 month

## 2018-07-19 NOTE — Addendum Note (Signed)
Addended by: Eual FinesBRIDGES, Denny Mccree P on: 07/19/2018 10:49 AM   Modules accepted: Orders

## 2018-07-19 NOTE — Assessment & Plan Note (Addendum)
Healthy Discussed DASH diet Tdap today Prefers no flu shot---discussed He does exercise

## 2018-08-29 ENCOUNTER — Ambulatory Visit (INDEPENDENT_AMBULATORY_CARE_PROVIDER_SITE_OTHER): Payer: 59 | Admitting: Internal Medicine

## 2018-08-29 ENCOUNTER — Encounter: Payer: Self-pay | Admitting: Internal Medicine

## 2018-08-29 VITALS — BP 126/88 | HR 104 | Temp 98.4°F | Ht 68.0 in | Wt 251.0 lb

## 2018-08-29 DIAGNOSIS — Z8042 Family history of malignant neoplasm of prostate: Secondary | ICD-10-CM | POA: Diagnosis not present

## 2018-08-29 DIAGNOSIS — I10 Essential (primary) hypertension: Secondary | ICD-10-CM | POA: Diagnosis not present

## 2018-08-29 NOTE — Progress Notes (Signed)
Subjective:    Patient ID: Jonathan Oconnor, male    DOB: 12/18/1975, 42 y.o.   MRN: [id number]  HPI Here for follow up of HTN No problems with the new medication  He doesn't check his BP No chest pain or SOB No dizziness or syncope No edema  Trying to eat healthier  Current Outpatient Medications on File Prior to Visit  Medication Sig Dispense Refill  . cetirizine (ZYRTEC) 10 MG tablet Take 10 mg by mouth daily as needed for allergies.    . fluticasone (FLONASE) 50 MCG/ACT nasal spray Place 2 sprays into both nostrils daily as needed for allergies or rhinitis.    . valsartan-hydrochlorothiazide (DIOVAN-HCT) 160-25 MG tablet Take 1 tablet by mouth daily. 90 tablet 3   No current facility-administered medications on file prior to visit.     Allergies  Allergen Reactions  . Avocado Other (See Comments)    Sore throat  . Other Nausea And Vomiting    Melons    Past Medical History:  Diagnosis Date  . Allergic rhinitis   . Chicken pox   . Epididymitis 03/27/04   [facility]  . Hypertension     Past Surgical History:  Procedure Laterality Date  . KNEE SURGERY Left 1982   surgery for ? infection in his knee    Family History  Problem Relation Age of Onset  . Hypertension Mother   . Diabetes Mother        prediabetes  . Cancer Maternal Grandfather        prostate  . Heart disease Maternal Grandfather   . Cancer Paternal Grandfather        prostate  . Stroke Paternal Grandfather   . Prostate cancer Father     Social History   Socioeconomic History  . Marital status: Married    Spouse name: Tangela  . Number of children: 2  . Years of education: 6112  . Highest education level: Not on file  Occupational History  . Occupation: Glass blower/designerAssembly    Employer: [employer]    Comment: Technical brewerrecor  Social Needs  . Financial resource strain: Not on file  . Food insecurity:    Worry: Not on file    Inability: Not on file  . Transportation needs:   Medical: Not on file    Non-medical: Not on file  Tobacco Use  . Smoking status: Former Smoker    Packs/day: 0.25    Years: 10.00    Pack years: 2.50    Types: Cigarettes    Last attempt to quit: 2018    Years since quitting: 1.9  . Smokeless tobacco: Never Used  Substance and Sexual Activity  . Alcohol use: Yes    Comment: Occasionally   . Drug use: No  . Sexual activity: Not on file  Lifestyle  . Physical activity:    Days per week: Not on file    Minutes per session: Not on file  . Stress: Not on file  Relationships  . Social connections:    Talks on phone: Not on file    Gets together: Not on file    Attends religious service: Not on file    Active member of club or organization: Not on file    Attends meetings of clubs or organizations: Not on file    Relationship status: Not on file  . Intimate partner violence:    Fear of current or ex partner: Not on file    Emotionally abused:  Not on file    Physically abused: Not on file    Forced sexual activity: Not on file  Other Topics Concern  . Not on file  Social History Narrative   Minerva Areolaric grew [place]. He is currently living in [place] with his wife [name](Tangela), daughter [name](Qiara) and his father-in-law. Step daughter --with his grandchild. He enjoys fishing on his spare time.    Review of Systems No edema Sleeps well Using copper fit gloves--helping his hands/wrists Requests PSA ---dad had prostate cancer (and they have wellness policy)    Objective:   Physical Exam  Constitutional: He appears well-developed. No distress.  Neck: No thyromegaly present.  Cardiovascular: Normal rate, regular rhythm and normal heart sounds. Exam reveals no gallop.  No murmur heard. Respiratory: Effort normal and breath sounds normal. No respiratory distress. He has no wheezes. He has no rales.  Musculoskeletal:        General: No edema.  Lymphadenopathy:    He has no cervical adenopathy.  Psychiatric: He has a normal  mood and affect. His behavior is normal.           Assessment & Plan:

## 2018-08-29 NOTE — Assessment & Plan Note (Signed)
BP Readings from Last 3 Encounters:  08/29/18 126/88  07/19/18 (!) 154/96  04/30/17 (!) 154/107   Good control now No problems with the medication Will check labs

## 2018-08-30 LAB — COMPREHENSIVE METABOLIC PANEL
ALBUMIN: 4.7 g/dL (ref 3.5–5.2)
ALK PHOS: 68 U/L (ref 39–117)
ALT: 19 U/L (ref 0–53)
AST: 19 U/L (ref 0–37)
BUN: 16 mg/dL (ref 6–23)
CHLORIDE: 99 meq/L (ref 96–112)
CO2: 29 mEq/L (ref 19–32)
CREATININE: 0.99 mg/dL (ref 0.40–1.50)
Calcium: 9.9 mg/dL (ref 8.4–10.5)
GFR: 107.2 mL/min (ref >60.00)
GLUCOSE: 89 mg/dL (ref 70–99)
POTASSIUM: 3.7 meq/L (ref 3.5–5.1)
SODIUM: 139 meq/L (ref 135–145)
TOTAL PROTEIN: 7.8 g/dL (ref 6.0–8.3)
Total Bilirubin: 1.1 mg/dL (ref 0.2–1.2)

## 2018-08-30 LAB — CBC
HEMATOCRIT: 42.4 % (ref 39.0–52.0)
Hemoglobin: 14.6 g/dL (ref 13.0–17.0)
MCHC: 34.6 g/dL (ref 30.0–36.0)
MCV: 82.3 fl (ref 78.0–100.0)
Platelets: 198 10*3/uL (ref 150.0–400.0)
RBC: 5.15 Mil/uL (ref 4.22–5.81)
RDW: 13 % (ref 11.5–15.5)
WBC: 6.1 10*3/uL (ref 4.0–10.5)

## 2018-08-30 LAB — PSA: PSA: 0.63 ng/mL (ref 0.10–4.00)

## 2018-10-07 DIAGNOSIS — M9901 Segmental and somatic dysfunction of cervical region: Secondary | ICD-10-CM | POA: Diagnosis not present

## 2018-10-07 DIAGNOSIS — M9902 Segmental and somatic dysfunction of thoracic region: Secondary | ICD-10-CM | POA: Diagnosis not present

## 2018-10-07 DIAGNOSIS — M531 Cervicobrachial syndrome: Secondary | ICD-10-CM | POA: Diagnosis not present

## 2018-11-04 DIAGNOSIS — M9902 Segmental and somatic dysfunction of thoracic region: Secondary | ICD-10-CM | POA: Diagnosis not present

## 2018-11-04 DIAGNOSIS — M9901 Segmental and somatic dysfunction of cervical region: Secondary | ICD-10-CM | POA: Diagnosis not present

## 2018-11-04 DIAGNOSIS — M531 Cervicobrachial syndrome: Secondary | ICD-10-CM | POA: Diagnosis not present

## 2018-12-11 DIAGNOSIS — M9902 Segmental and somatic dysfunction of thoracic region: Secondary | ICD-10-CM | POA: Diagnosis not present

## 2018-12-11 DIAGNOSIS — M9901 Segmental and somatic dysfunction of cervical region: Secondary | ICD-10-CM | POA: Diagnosis not present

## 2018-12-11 DIAGNOSIS — M531 Cervicobrachial syndrome: Secondary | ICD-10-CM | POA: Diagnosis not present

## 2018-12-11 MED ORDER — CETIRIZINE HCL 10 MG PO TABS: 10.0000 mg | ORAL_TABLET | Freq: Every day | ORAL | 11 refills | Status: DC | PRN

## 2019-01-23 DIAGNOSIS — M9901 Segmental and somatic dysfunction of cervical region: Secondary | ICD-10-CM | POA: Diagnosis not present

## 2019-01-23 DIAGNOSIS — M531 Cervicobrachial syndrome: Secondary | ICD-10-CM | POA: Diagnosis not present

## 2019-01-23 DIAGNOSIS — M9902 Segmental and somatic dysfunction of thoracic region: Secondary | ICD-10-CM | POA: Diagnosis not present

## 2019-09-01 ENCOUNTER — Encounter: Payer: 59 | Admitting: Internal Medicine

## 2019-10-10 ENCOUNTER — Encounter: Payer: Self-pay | Admitting: Internal Medicine

## 2019-10-10 ENCOUNTER — Ambulatory Visit (INDEPENDENT_AMBULATORY_CARE_PROVIDER_SITE_OTHER): Payer: Managed Care, Other (non HMO) | Admitting: Internal Medicine

## 2019-10-10 ENCOUNTER — Other Ambulatory Visit: Payer: Self-pay

## 2019-10-10 VITALS — BP 138/90 | HR 81 | Temp 97.1°F | Ht 68.0 in | Wt 272.0 lb

## 2019-10-10 DIAGNOSIS — J301 Allergic rhinitis due to pollen: Secondary | ICD-10-CM

## 2019-10-10 DIAGNOSIS — I10 Essential (primary) hypertension: Secondary | ICD-10-CM

## 2019-10-10 DIAGNOSIS — Z0000 Encounter for general adult medical examination without abnormal findings: Secondary | ICD-10-CM | POA: Diagnosis not present

## 2019-10-10 LAB — COMPREHENSIVE METABOLIC PANEL
ALT: 17 U/L (ref 0–53)
AST: 16 U/L (ref 0–37)
Albumin: 4.3 g/dL (ref 3.5–5.2)
Alkaline Phosphatase: 98 U/L (ref 39–117)
BUN: 15 mg/dL (ref 6–23)
CO2: 28 mEq/L (ref 19–32)
Calcium: 9.1 mg/dL (ref 8.4–10.5)
Chloride: 105 mEq/L (ref 96–112)
Creatinine, Ser: 1 mg/dL (ref 0.40–1.50)
GFR: 99.15 mL/min (ref >60.00)
Glucose, Bld: 105 mg/dL — ABNORMAL HIGH (ref 70–99)
Potassium: 4.2 mEq/L (ref 3.5–5.1)
Sodium: 140 mEq/L (ref 135–145)
Total Bilirubin: 0.8 mg/dL (ref 0.2–1.2)
Total Protein: 7.2 g/dL (ref 6.0–8.3)

## 2019-10-10 LAB — CBC
HCT: 40.5 % (ref 39.0–52.0)
Hemoglobin: 13.8 g/dL (ref 13.0–17.0)
MCHC: 34.1 g/dL (ref 30.0–36.0)
MCV: 82.6 fl (ref 78.0–100.0)
Platelets: 179 10*3/uL (ref 150.0–400.0)
RBC: 4.91 Mil/uL (ref 4.22–5.81)
RDW: 14.3 % (ref 11.5–15.5)
WBC: 4.9 10*3/uL (ref 4.0–10.5)

## 2019-10-10 MED ORDER — VALSARTAN-HYDROCHLOROTHIAZIDE 160-25 MG PO TABS: 1.0000 | ORAL_TABLET | Freq: Every day | ORAL | 3 refills | Status: DC

## 2019-10-10 NOTE — Assessment & Plan Note (Signed)
BP Readings from Last 3 Encounters:  10/10/19 138/90  08/29/18 126/88  07/19/18 (!) 154/96   Reasonable control Continue med Check labs

## 2019-10-10 NOTE — Assessment & Plan Note (Signed)
Does okay with meds 

## 2019-10-10 NOTE — Assessment & Plan Note (Addendum)
Healthy but has let himself go Now starting health program Doesn't take flu vaccine Expects to take COVID vaccine Defer PSA--(FH) Consider referral for sleep evaluation if persistent daytime somnolence or wife notes apnea

## 2019-10-10 NOTE — Progress Notes (Signed)
Subjective:    Patient ID: Jonathan Oconnor, male    DOB: 12-01-1975, 44 y.o.   MRN: [id number]  HPI Here for physical This visit occurred during the SARS-CoV-2 public health emergency.  Safety protocols were in place, including screening questions prior to the visit, additional usage of staff PPE, and extensive cleaning of exam room while observing appropriate contact time as indicated for disinfecting solutions.   Now working at [employer] Has gained 20#!! FIL had stayed with them for some time---all "fell off" proper eating Now going to [facility]  Current Outpatient Medications on File Prior to Visit  Medication Sig Dispense Refill  . cetirizine (ZYRTEC) 10 MG tablet Take 1 tablet (10 mg total) by mouth daily as needed for allergies. 30 tablet 11  . fluticasone (FLONASE) 50 MCG/ACT nasal spray Place 2 sprays into both nostrils daily as needed for allergies or rhinitis.    . valsartan-hydrochlorothiazide (DIOVAN-HCT) 160-25 MG tablet Take 1 tablet by mouth daily. 90 tablet 3   No current facility-administered medications on file prior to visit.    Allergies  Allergen Reactions  . Avocado Other (See Comments)    Sore throat  . Other Nausea And Vomiting    Melons    Past Medical History:  Diagnosis Date  . Allergic rhinitis   . Chicken pox   . Epididymitis 03/27/04   ED visit [facility]  . Hypertension     Past Surgical History:  Procedure Laterality Date  . KNEE SURGERY Left 1982   surgery for ? infection in his knee    Family History  Problem Relation Age of Onset  . Hypertension Mother   . Diabetes Mother        prediabetes  . Cancer Maternal Grandfather        prostate  . Heart disease Maternal Grandfather   . Cancer Paternal Grandfather        prostate  . Stroke Paternal Grandfather   . Prostate cancer Father     Social History   Socioeconomic History  . Marital status: Married    Spouse name: Tangela  . Number of children: 2  . Years of  education: 64  . Highest education level: Not on file  Occupational History  . Occupation: Arts development officer: [employer]    Comment:    Tobacco Use  . Smoking status: Former Smoker    Packs/day: 0.25    Years: 10.00    Pack years: 2.50    Types: Cigarettes    Quit date: 2018    Years since quitting: 3.0  . Smokeless tobacco: Never Used  Substance and Sexual Activity  . Alcohol use: Yes    Comment: Occasionally   . Drug use: No  . Sexual activity: Not on file  Other Topics Concern  . Not on file  Social History Narrative   Jamason grew up in [place]. He is currently living in [place] with his wife Joaquin Bend), daughter Varney Daily) and his father-in-law. Step daughter --with his grandchild. He enjoys fishing on his spare time.    Social Determinants of Health   Financial Resource Strain:   . Difficulty of Paying Living Expenses: Not on file  Food Insecurity:   . Worried About Charity fundraiser in the Last Year: Not on file  . Ran Out of Food in the Last Year: Not on file  Transportation Needs:   . Lack of Transportation (Medical): Not on file  . Lack of Transportation (  Non-Medical): Not on file  Physical Activity:   . Days of Exercise per Week: Not on file  . Minutes of Exercise per Session: Not on file  Stress:   . Feeling of Stress : Not on file  Social Connections:   . Frequency of Communication with Friends and Family: Not on file  . Frequency of Social Gatherings with Friends and Family: Not on file  . Attends Religious Services: Not on file  . Active Member of Clubs or Organizations: Not on file  . Attends Banker Meetings: Not on file  . Marital Status: Not on file  Intimate Partner Violence:   . Fear of Current or Ex-Partner: Not on file  . Emotionally Abused: Not on file  . Physically Abused: Not on file  . Sexually Abused: Not on file   Review of Systems  Constitutional: Positive for unexpected weight change. Negative for  fatigue.       Wears seat belt  HENT: Negative for dental problem, hearing loss, tinnitus and trouble swallowing.        Keeps up with dentist  Eyes: Negative for visual disturbance.       No diplopia or unilateral vision loss  Respiratory: Negative for cough, chest tightness and shortness of breath.   Cardiovascular: Negative for chest pain, palpitations and leg swelling.  Gastrointestinal: Negative for abdominal pain, blood in stool and constipation.       Occ heartburn--- uses OTC med with success (rare)  Endocrine: Negative for polydipsia and polyuria.  Genitourinary: Positive for frequency and urgency. Negative for difficulty urinating.       No sexual problems  Musculoskeletal: Negative for arthralgias, back pain and joint swelling.       Some hand swelling----?early CTS  Skin: Negative for rash.  Allergic/Immunologic: Positive for environmental allergies. Negative for immunocompromised state.  Neurological: Negative for dizziness, syncope and light-headedness.       Rare migraine type headache---excedrin helps  Hematological: Negative for adenopathy. Does not bruise/bleed easily.  Psychiatric/Behavioral: Negative for dysphoric mood. The patient is not nervous/anxious.        Awakens at night at times. Snores but no apnea Some daytime somnolence       Objective:   Physical Exam  Constitutional: He is oriented to person, place, and time. He appears well-developed. No distress.  HENT:  Head: Normocephalic and atraumatic.  Right Ear: External ear normal.  Left Ear: External ear normal.  Mouth/Throat: Oropharynx is clear and moist. No oropharyngeal exudate.  Eyes: Pupils are equal, round, and reactive to light. Conjunctivae are normal.  Neck: No thyromegaly present.  Cardiovascular: Normal rate, regular rhythm, normal heart sounds and intact distal pulses. Exam reveals no gallop.  No murmur heard. Respiratory: Effort normal and breath sounds normal. No respiratory distress. He  has no wheezes. He has no rales.  GI: Soft. There is no abdominal tenderness.  Musculoskeletal:        General: No tenderness or edema.  Lymphadenopathy:    He has no cervical adenopathy.  Neurological: He is alert and oriented to person, place, and time.  Skin: No rash noted. No erythema.  Psychiatric: He has a normal mood and affect. His behavior is normal.           Assessment & Plan:

## 2020-03-15 ENCOUNTER — Ambulatory Visit: Payer: Managed Care, Other (non HMO) | Attending: Internal Medicine

## 2020-03-15 DIAGNOSIS — Z23 Encounter for immunization: Secondary | ICD-10-CM

## 2020-03-15 NOTE — Progress Notes (Signed)
   Covid-19 Vaccination Clinic  Name:  Jonathan Oconnor    MRN: [id number] DOB: 27-Dec-1975  03/15/2020  [name] was observed post Covid-19 immunization for 15 minutes without incident. He was provided with Vaccine Information Sheet and instruction to access the V-Safe system.   [name] was instructed to call 911 with any severe reactions post vaccine: Marland Kitchen Difficulty breathing  . Swelling of face and throat  . A fast heartbeat  . A bad rash all over body  . Dizziness and weakness   Immunizations Administered    Name Date Dose VIS Date Route   Pfizer COVID-19 Vaccine 03/15/2020 11:08 AM 0.3 mL 10/29/2018 Intramuscular   Manufacturer: [employer], [employer]   Lot: [id number]   NDC: [address]-5

## 2020-04-06 ENCOUNTER — Ambulatory Visit: Payer: Managed Care, Other (non HMO) | Attending: Internal Medicine

## 2020-04-06 DIAGNOSIS — Z23 Encounter for immunization: Secondary | ICD-10-CM

## 2020-04-06 NOTE — Progress Notes (Signed)
   Covid-19 Vaccination Clinic  Name:  Jonathan Oconnor    MRN: [id number] DOB: Jan 21, 1976  04/06/2020  Mr. Jonathan Oconnor was observed post Covid-19 immunization for 15 minutes without incident. He was provided with Vaccine Information Sheet and instruction to access the V-Safe system.   Mr. Jonathan Oconnor was instructed to call 911 with any severe reactions post vaccine: Marland Kitchen Difficulty breathing  . Swelling of face and throat  . A fast heartbeat  . A bad rash all over body  . Dizziness and weakness   Immunizations Administered    Name Date Dose VIS Date Route   Pfizer COVID-19 Vaccine 04/06/2020 11:30 AM 0.3 mL 10/29/2018 Intramuscular   Manufacturer: [employer], [employer]   Lot: [id number]   NDC: [address]-3

## 2020-10-04 ENCOUNTER — Other Ambulatory Visit: Payer: Managed Care, Other (non HMO)

## 2020-10-04 DIAGNOSIS — Z20822 Contact with and (suspected) exposure to covid-19: Secondary | ICD-10-CM

## 2020-10-05 LAB — NOVEL CORONAVIRUS, NAA: SARS-CoV-2, NAA: DETECTED — AB

## 2020-10-05 LAB — SARS-COV-2, NAA 2 DAY TAT

## 2020-10-06 ENCOUNTER — Telehealth: Payer: Self-pay | Admitting: *Deleted

## 2020-10-06 NOTE — Telephone Encounter (Signed)
Jonathan Oconnor    My name is Darnelle Catalan and I am a Engineer, civil (consulting) at [facility].   We tried calling you to see how you're feeling and to discuss your interest in one of the available treatments to treat early COVID-19 disease. These treatments are available for certain patients who are at risk for more severe symptoms or potential hospitalization related to COVID.   If you're treated early, your chance of severe disease symptoms and hospitalization can be greatly reduced.   If you want to receive a call back to discuss these treatments further, please either respond to this MyChart message or call our hotline back at [phone]. If you call, please leave a voicemail with your name, telephone number and date of birth and tell [place] you were contacted as a possible patient to receive treatment by our team.   Your medical history, medication list and the date your illness started will help [place] decide the best treatment for you.   Regards,  Darnelle Catalan, RN

## 2020-10-08 ENCOUNTER — Telehealth: Payer: Self-pay

## 2020-10-08 NOTE — Telephone Encounter (Signed)
Pt called to say he has Covid. Asking what he can take for chest congestion. I advised him he can take Mucinex DM. He has an appt next Wednesday. Asked if he needed to reschedule. I advised him to call back Tuesday to see how he was feeling.

## 2020-10-13 ENCOUNTER — Encounter: Payer: Managed Care, Other (non HMO) | Admitting: Internal Medicine

## 2020-11-24 ENCOUNTER — Other Ambulatory Visit: Payer: Self-pay | Admitting: Internal Medicine

## 2020-12-08 ENCOUNTER — Ambulatory Visit (INDEPENDENT_AMBULATORY_CARE_PROVIDER_SITE_OTHER): Payer: Managed Care, Other (non HMO) | Admitting: Internal Medicine

## 2020-12-08 ENCOUNTER — Encounter: Payer: Self-pay | Admitting: Internal Medicine

## 2020-12-08 ENCOUNTER — Other Ambulatory Visit: Payer: Self-pay

## 2020-12-08 VITALS — BP 140/88 | HR 87 | Temp 98.2°F | Ht 68.0 in | Wt 266.0 lb

## 2020-12-08 DIAGNOSIS — Z0000 Encounter for general adult medical examination without abnormal findings: Secondary | ICD-10-CM

## 2020-12-08 DIAGNOSIS — J301 Allergic rhinitis due to pollen: Secondary | ICD-10-CM

## 2020-12-08 DIAGNOSIS — I10 Essential (primary) hypertension: Secondary | ICD-10-CM | POA: Diagnosis not present

## 2020-12-08 LAB — COMPREHENSIVE METABOLIC PANEL
ALT: 21 U/L (ref 0–53)
AST: 18 U/L (ref 0–37)
Albumin: 4.4 g/dL (ref 3.5–5.2)
Alkaline Phosphatase: 74 U/L (ref 39–117)
BUN: 10 mg/dL (ref 6–23)
CO2: 33 mEq/L — ABNORMAL HIGH (ref 19–32)
Calcium: 9.2 mg/dL (ref 8.4–10.5)
Chloride: 100 mEq/L (ref 96–112)
Creatinine, Ser: 0.99 mg/dL (ref 0.40–1.50)
GFR: 93.53 mL/min (ref >60.00)
Glucose, Bld: 97 mg/dL (ref 70–99)
Potassium: 3.7 mEq/L (ref 3.5–5.1)
Sodium: 139 mEq/L (ref 135–145)
Total Bilirubin: 1.7 mg/dL — ABNORMAL HIGH (ref 0.2–1.2)
Total Protein: 7.7 g/dL (ref 6.0–8.3)

## 2020-12-08 LAB — CBC
HCT: 40.9 % (ref 39.0–52.0)
Hemoglobin: 14.1 g/dL (ref 13.0–17.0)
MCHC: 34.4 g/dL (ref 30.0–36.0)
MCV: 81.4 fl (ref 78.0–100.0)
Platelets: 192 10*3/uL (ref 150.0–400.0)
RBC: 5.02 Mil/uL (ref 4.22–5.81)
RDW: 14 % (ref 11.5–15.5)
WBC: 4.2 10*3/uL (ref 4.0–10.5)

## 2020-12-08 MED ORDER — CETIRIZINE HCL 10 MG PO TABS: 10.0000 mg | ORAL_TABLET | Freq: Every day | ORAL | 11 refills | Status: DC | PRN

## 2020-12-08 MED ORDER — FLUTICASONE PROPIONATE 50 MCG/ACT NA SUSP: 2.0000 | Freq: Every day | NASAL | 5 refills | Status: DC | PRN

## 2020-12-08 NOTE — Assessment & Plan Note (Signed)
BP Readings from Last 3 Encounters:  12/08/20 140/88  10/10/19 138/90  08/29/18 126/88   Doing fine on the valsartan/HCTZ Will check labs

## 2020-12-08 NOTE — Patient Instructions (Signed)

## 2020-12-08 NOTE — Assessment & Plan Note (Signed)
Does okay with OTC meds

## 2020-12-08 NOTE — Assessment & Plan Note (Signed)
Healthy but still needs to work on lifestyle DASH info given Will need COVID booster Prefers no flu vaccine Colon cancer screening at 47 Consider PSA at 26 (FH)  If more trouble with ED, will send Rx for him (he will email me)

## 2020-12-08 NOTE — Progress Notes (Signed)
Subjective:    Patient ID: Jonathan Oconnor, male    DOB: 11-16-1975, 45 y.o.   MRN: [id number]  HPI Here for physical This visit occurred during the SARS-CoV-2 public health emergency.  Safety protocols were in place, including screening questions prior to the visit, additional usage of staff PPE, and extensive cleaning of exam room while observing appropriate contact time as indicated for disinfecting solutions.   He notices some ED Discussed this  No other problems  Current Outpatient Medications on File Prior to Visit  Medication Sig Dispense Refill  . cetirizine (ZYRTEC) 10 MG tablet Take 1 tablet (10 mg total) by mouth daily as needed for allergies. 30 tablet 11  . fluticasone (FLONASE) 50 MCG/ACT nasal spray Place 2 sprays into both nostrils daily as needed for allergies or rhinitis.    . valsartan-hydrochlorothiazide (DIOVAN-HCT) 160-25 MG tablet TAKE 1 TABLET BY MOUTH EVERY DAY 30 tablet 0   No current facility-administered medications on file prior to visit.    Allergies  Allergen Reactions  . Avocado Other (See Comments)    Sore throat  . Other Nausea And Vomiting    Melons    Past Medical History:  Diagnosis Date  . Allergic rhinitis   . Chicken pox   . Epididymitis 03/27/04   ED visit [facility]  . Hypertension     Past Surgical History:  Procedure Laterality Date  . KNEE SURGERY Left 1982   surgery for ? infection in his knee    Family History  Problem Relation Age of Onset  . Hypertension Mother   . Diabetes Mother        prediabetes  . Cancer Maternal Grandfather        prostate  . Heart disease Maternal Grandfather   . Cancer Paternal Grandfather        prostate  . Stroke Paternal Grandfather   . Prostate cancer Father   . Alcohol abuse Father     Social History   Socioeconomic History  . Marital status: Married    Spouse name: Jonathan Oconnor  . Number of children: 2  . Years of education: 67  . Highest education level: Not on file   Occupational History  . Occupation: Midwife: [employer]    Comment:    Tobacco Use  . Smoking status: Former Smoker    Packs/day: 0.25    Years: 10.00    Pack years: 2.50    Types: Cigarettes    Quit date: 2018    Years since quitting: 4.2  . Smokeless tobacco: Never Used  Substance and Sexual Activity  . Alcohol use: Yes    Comment: Occasionally   . Drug use: No  . Sexual activity: Not on file  Other Topics Concern  . Not on file  Social History Narrative   Jonathan Oconnor grew up in [place]. He is currently living in [place] with his wife Jonathan Oconnor), daughter Jonathan Oconnor) and his father-in-law. Step daughter --with his grandchild. He enjoys fishing on his spare time.    Social Determinants of Health   Financial Resource Strain: Not on file  Food Insecurity: Not on file  Transportation Needs: Not on file  Physical Activity: Not on file  Stress: Not on file  Social Connections: Not on file  Intimate Partner Violence: Not on file   Review of Systems  Constitutional: Negative for fatigue.       Weight down 6# Working on lifestyle Not much exercise--discussed  HENT: Negative for dental  problem, hearing loss, tinnitus and trouble swallowing.        Keeps up with dentist  Eyes: Negative for visual disturbance.       Has reading glasses now No diplopia or unilateral vision loss  Respiratory: Negative for chest tightness.        Cough and SOB with COVID---improving now  Cardiovascular: Positive for palpitations. Negative for leg swelling.       Easier DOE  Gastrointestinal: Negative for blood in stool and constipation.       Occ indigestion--no meds (he notices with mints--discussed avoiding)  Endocrine: Negative for polydipsia and polyuria.  Genitourinary: Positive for urgency. Negative for difficulty urinating.  Musculoskeletal: Negative for arthralgias and joint swelling.       Back pain last month---better now (with increased water intake)  Skin:  Negative for rash.       No suspicious skin lesions  Allergic/Immunologic: Positive for environmental allergies. Negative for immunocompromised state.  Neurological: Negative for dizziness, syncope, light-headedness and headaches.  Hematological: Negative for adenopathy. Does not bruise/bleed easily.  Psychiatric/Behavioral: Negative for dysphoric mood. The patient is not nervous/anxious.        Sleeping okay now. Awakens refreshed.  No sig daytime somnolence now       Objective:   Physical Exam Constitutional:      Appearance: Normal appearance.  HENT:     Right Ear: Tympanic membrane, ear canal and external ear normal.     Left Ear: Tympanic membrane, ear canal and external ear normal.     Mouth/Throat:     Pharynx: No oropharyngeal exudate or posterior oropharyngeal erythema.  Eyes:     Conjunctiva/sclera: Conjunctivae normal.     Pupils: Pupils are equal, round, and reactive to light.  Cardiovascular:     Rate and Rhythm: Normal rate and regular rhythm.     Pulses: Normal pulses.     Heart sounds: No murmur heard. No gallop.   Pulmonary:     Effort: Pulmonary effort is normal.     Breath sounds: Normal breath sounds. No wheezing or rales.  Abdominal:     Palpations: Abdomen is soft.     Tenderness: There is no abdominal tenderness.  Musculoskeletal:     Cervical back: Neck supple.     Right lower leg: No edema.     Left lower leg: No edema.  Lymphadenopathy:     Cervical: No cervical adenopathy.  Skin:    Findings: No rash.     Comments: 31mm pigmented lesion on right shoulder (no recent changes per patient). Discussed derm visit if any changes  Neurological:     General: No focal deficit present.     Mental Status: He is alert and oriented to person, place, and time.  Psychiatric:        Mood and Affect: Mood normal.        Behavior: Behavior normal.            Assessment & Plan:

## 2020-12-13 MED ORDER — TADALAFIL 20 MG PO TABS: 10.0000 mg | ORAL_TABLET | ORAL | 11 refills | Status: DC | PRN

## 2020-12-13 NOTE — Telephone Encounter (Signed)
Make sure to let him know he will need a Good Rx card or download the app.

## 2020-12-21 ENCOUNTER — Other Ambulatory Visit: Payer: Self-pay | Admitting: Internal Medicine

## 2021-12-16 ENCOUNTER — Ambulatory Visit (INDEPENDENT_AMBULATORY_CARE_PROVIDER_SITE_OTHER): Payer: Managed Care, Other (non HMO) | Admitting: Internal Medicine

## 2021-12-16 ENCOUNTER — Encounter: Payer: Self-pay | Admitting: Internal Medicine

## 2021-12-16 VITALS — BP 120/74 | HR 71 | Ht 66.5 in | Wt 252.8 lb

## 2021-12-16 DIAGNOSIS — Z0000 Encounter for general adult medical examination without abnormal findings: Secondary | ICD-10-CM | POA: Diagnosis not present

## 2021-12-16 DIAGNOSIS — N400 Enlarged prostate without lower urinary tract symptoms: Secondary | ICD-10-CM | POA: Insufficient documentation

## 2021-12-16 DIAGNOSIS — G479 Sleep disorder, unspecified: Secondary | ICD-10-CM | POA: Insufficient documentation

## 2021-12-16 DIAGNOSIS — N401 Enlarged prostate with lower urinary tract symptoms: Secondary | ICD-10-CM | POA: Diagnosis not present

## 2021-12-16 DIAGNOSIS — I10 Essential (primary) hypertension: Secondary | ICD-10-CM

## 2021-12-16 LAB — CBC
HCT: 38.4 % — ABNORMAL LOW (ref 39.0–52.0)
Hemoglobin: 13.2 g/dL (ref 13.0–17.0)
MCHC: 34.3 g/dL (ref 30.0–36.0)
MCV: 82.6 fl (ref 78.0–100.0)
Platelets: 181 10*3/uL (ref 150.0–400.0)
RBC: 4.65 Mil/uL (ref 4.22–5.81)
RDW: 14 % (ref 11.5–15.5)
WBC: 4.4 10*3/uL (ref 4.0–10.5)

## 2021-12-16 LAB — COMPREHENSIVE METABOLIC PANEL
ALT: 19 U/L (ref 0–53)
AST: 21 U/L (ref 0–37)
Albumin: 4.3 g/dL (ref 3.5–5.2)
Alkaline Phosphatase: 67 U/L (ref 39–117)
BUN: 15 mg/dL (ref 6–23)
CO2: 30 mEq/L (ref 19–32)
Calcium: 8.7 mg/dL (ref 8.4–10.5)
Chloride: 101 mEq/L (ref 96–112)
Creatinine, Ser: 1.02 mg/dL (ref 0.40–1.50)
GFR: 89.59 mL/min (ref >60.00)
Glucose, Bld: 101 mg/dL — ABNORMAL HIGH (ref 70–99)
Potassium: 3.3 mEq/L — ABNORMAL LOW (ref 3.5–5.1)
Sodium: 138 mEq/L (ref 135–145)
Total Bilirubin: 1.6 mg/dL — ABNORMAL HIGH (ref 0.2–1.2)
Total Protein: 7.1 g/dL (ref 6.0–8.3)

## 2021-12-16 LAB — LIPID PANEL
Cholesterol: 155 mg/dL (ref 0–200)
HDL: 43.9 mg/dL (ref >39.00)
LDL Cholesterol: 100 mg/dL — ABNORMAL HIGH (ref 0–99)
NonHDL: 111.24
Total CHOL/HDL Ratio: 4
Triglycerides: 57 mg/dL (ref 0.0–149.0)
VLDL: 11.4 mg/dL (ref 0.0–40.0)

## 2021-12-16 MED ORDER — TADALAFIL 5 MG PO TABS: 5.0000 mg | ORAL_TABLET | Freq: Every day | ORAL | 11 refills | Status: DC

## 2021-12-16 NOTE — Assessment & Plan Note (Signed)
Continues to slowly lose weight with lifestyle measures ?Colon cancer screening starting next year ?PSA at 4 (FH) ?Prefers no flu vaccine ?Should have COVID booster by this fall ?

## 2021-12-16 NOTE — Assessment & Plan Note (Signed)
BP Readings from Last 3 Encounters:  ?12/16/21 120/74  ?12/08/20 140/88  ?10/10/19 138/90  ? ?Good control on valsartan/HCTZ ?Will check labs ?

## 2021-12-16 NOTE — Assessment & Plan Note (Signed)
Suggests OSA ?Will set up with sleep referral ?

## 2021-12-16 NOTE — Progress Notes (Signed)
? ?Subjective:  ? ? Patient ID: Jonathan Oconnor, male    DOB: 10-01-1975, 46 y.o.   MRN: [id number] ? ?HPI ?Here for physical ? ?Concerned about sleep apnea ?Wife notes snoring and some apnea ?Does have some daytime somnolence ? ?Satisfied with tadalafil---had to cut in half ? ?Trying to eat better----smaller portions, less sweets ?Stays active in summer---less so in winter ? ?Current Outpatient Medications on File Prior to Visit  ?Medication Sig Dispense Refill  ? cetirizine (ZYRTEC) 10 MG tablet Take 1 tablet (10 mg total) by mouth daily as needed for allergies. 30 tablet 11  ? fluticasone (FLONASE) 50 MCG/ACT nasal spray Place 2 sprays into both nostrils daily as needed for allergies or rhinitis. 16 g 5  ? tadalafil (CIALIS) 20 MG tablet Take 0.5-1 tablets (10-20 mg total) by mouth every other day as needed for erectile dysfunction. 10 tablet 11  ? valsartan-hydrochlorothiazide (DIOVAN-HCT) 160-25 MG tablet TAKE 1 TABLET BY MOUTH EVERY DAY 90 tablet 3  ? ?No current facility-administered medications on file prior to visit.  ? ? ?Allergies  ?Allergen Reactions  ? Avocado Other (See Comments)  ?  Sore throat  ? Other Nausea And Vomiting  ?  Melons  ? ? ?Past Medical History:  ?Diagnosis Date  ? Allergic rhinitis   ? Chicken pox   ? Epididymitis 03/27/04  ? ED visit [facility]  ? Hypertension   ? ? ?Past Surgical History:  ?Procedure Laterality Date  ? KNEE SURGERY Left 1982  ? surgery for ? infection in his knee  ? ? ?Family History  ?Problem Relation Age of Onset  ? Hypertension Mother   ? Diabetes Mother   ?     prediabetes  ? Cancer Maternal Grandfather   ?     prostate  ? Heart disease Maternal Grandfather   ? Cancer Paternal Grandfather   ?     prostate  ? Stroke Paternal Grandfather   ? Prostate cancer Father   ? Alcohol abuse Father   ? ? ?Social History  ? ?Socioeconomic History  ? Marital status: Married  ?  Spouse name: Joaquin Bend  ? Number of children: 2  ? Years of education: 72  ? Highest education level:  Not on file  ?Occupational History  ? Occupation: Psychologist, educational  ?  Employer: [employer]  ?  Comment:    ?Tobacco Use  ? Smoking status: Former  ?  Packs/day: 0.25  ?  Years: 10.00  ?  Pack years: 2.50  ?  Types: Cigarettes  ?  Quit date: 2018  ?  Years since quitting: 5.2  ? Smokeless tobacco: Never  ?Substance and Sexual Activity  ? Alcohol use: Yes  ?  Comment: Occasionally   ? Drug use: No  ? Sexual activity: Not on file  ?Other Topics Concern  ? Not on file  ?Social History Narrative  ? Eligah grew up in [place]. He is currently living in [place] with his wife Joaquin Bend), daughter Varney Daily) and his father-in-law. Step daughter --with his grandchild. He enjoys fishing on his spare time.   ? ?Social Determinants of Health  ? ?Financial Resource Strain: Not on file  ?Food Insecurity: Not on file  ?Transportation Needs: Not on file  ?Physical Activity: Not on file  ?Stress: Not on file  ?Social Connections: Not on file  ?Intimate Partner Violence: Not on file  ? ?Review of Systems  ?Constitutional:  Negative for fatigue.  ?     Wears seat belt  ?  HENT:  Negative for hearing loss, tinnitus and trouble swallowing.   ?     Keeps up with dentist  ?Eyes:   ?     Notices trouble reading---needs eval (does have readers)  ?Respiratory:  Negative for cough and chest tightness.   ?     Does note SOB at times--?related to smoke exposure, etc  ?Cardiovascular:  Negative for chest pain, palpitations and leg swelling.  ?Gastrointestinal:  Negative for blood in stool and constipation.  ?     Occasional acid reflux---hasn't needed med  ?Endocrine: Negative for polydipsia and polyuria.  ?Genitourinary:  Positive for frequency. Negative for urgency.  ?     Voids okay but urgency/frequency  ?Musculoskeletal:  Negative for arthralgias, back pain and joint swelling.  ?Skin:  Negative for rash.  ?Allergic/Immunologic: Positive for environmental allergies. Negative for immunocompromised state.  ?     Takes zyrtec and trying  local honey ?Hasn't used flonase yet  ?Neurological:  Negative for dizziness, syncope and light-headedness.  ?     Occ headache with tadalafil  ?Hematological:  Negative for adenopathy. Does not bruise/bleed easily.  ?Psychiatric/Behavioral:  Negative for dysphoric mood.   ?     Mild anxiety---like when driving (nothing serious)  ? ?   ?Objective:  ? Physical Exam ?Constitutional:   ?   Appearance: Normal appearance.  ?HENT:  ?   Mouth/Throat:  ?   Pharynx: No oropharyngeal exudate or posterior oropharyngeal erythema.  ?Eyes:  ?   Conjunctiva/sclera: Conjunctivae normal.  ?   Pupils: Pupils are equal, round, and reactive to light.  ?Cardiovascular:  ?   Rate and Rhythm: Normal rate and regular rhythm.  ?   Pulses: Normal pulses.  ?   Heart sounds: No murmur heard. ?  No gallop.  ?Pulmonary:  ?   Effort: Pulmonary effort is normal.  ?   Breath sounds: Normal breath sounds. No wheezing or rales.  ?Abdominal:  ?   Palpations: Abdomen is soft.  ?   Tenderness: There is no abdominal tenderness.  ?Musculoskeletal:  ?   Cervical back: Neck supple.  ?   Right lower leg: No edema.  ?   Left lower leg: No edema.  ?Lymphadenopathy:  ?   Cervical: No cervical adenopathy.  ?Skin: ?   Findings: No lesion or rash.  ?   Comments: Same melanotic lesion on right shoulder  ?Neurological:  ?   General: No focal deficit present.  ?   Mental Status: He is alert and oriented to person, place, and time.  ?Psychiatric:     ?   Mood and Affect: Mood normal.     ?   Behavior: Behavior normal.  ?  ? ? ? ? ?   ?Assessment & Plan:  ? ?

## 2021-12-16 NOTE — Assessment & Plan Note (Signed)
Bothersome symptoms ?Will change tadalafil to 5mg  daily for this and ED ?

## 2021-12-18 ENCOUNTER — Other Ambulatory Visit: Payer: Self-pay | Admitting: Internal Medicine

## 2021-12-21 ENCOUNTER — Encounter: Payer: Self-pay | Admitting: Internal Medicine

## 2021-12-21 NOTE — Telephone Encounter (Signed)
Hey  can you look into this for me thanks. ?

## 2021-12-23 NOTE — Telephone Encounter (Signed)
Jonathan Oconnor,  ? ?This referral was sent to [facility] 12/16/2021 ? ?I do not schedule Sleep Studies nor precert them. They are sent to the location that does the sleep study or eval/treats sleep apnea and they reach out to the patient to schedule.  ? ?If you look in the chart under Chart Review, then click Referral tab you can see the referral to [facility].  ? ?They will review the referral and contact the patient to schedule.  ?

## 2022-01-13 ENCOUNTER — Other Ambulatory Visit: Payer: Self-pay | Admitting: Internal Medicine

## 2022-02-24 ENCOUNTER — Ambulatory Visit (INDEPENDENT_AMBULATORY_CARE_PROVIDER_SITE_OTHER): Payer: Managed Care, Other (non HMO) | Admitting: Adult Health

## 2022-02-24 ENCOUNTER — Encounter: Payer: Self-pay | Admitting: Adult Health

## 2022-02-24 DIAGNOSIS — R0683 Snoring: Secondary | ICD-10-CM | POA: Insufficient documentation

## 2022-02-24 NOTE — Assessment & Plan Note (Signed)
Healthy weight loss 

## 2022-03-23 ENCOUNTER — Other Ambulatory Visit: Payer: Self-pay | Admitting: Internal Medicine

## 2022-03-24 ENCOUNTER — Ambulatory Visit: Payer: Managed Care, Other (non HMO)

## 2022-03-24 DIAGNOSIS — G4733 Obstructive sleep apnea (adult) (pediatric): Secondary | ICD-10-CM | POA: Diagnosis not present

## 2022-03-24 DIAGNOSIS — R0683 Snoring: Secondary | ICD-10-CM

## 2022-04-05 ENCOUNTER — Telehealth: Payer: Managed Care, Other (non HMO) | Admitting: Adult Health

## 2022-04-10 DIAGNOSIS — G4733 Obstructive sleep apnea (adult) (pediatric): Secondary | ICD-10-CM

## 2022-04-20 ENCOUNTER — Telehealth: Payer: Self-pay | Admitting: Adult Health

## 2022-04-20 NOTE — Telephone Encounter (Signed)
Home sleep study done on March 24, 2022 showed moderate obstructive sleep apnea with AHI at 26/hour and SPO2 low at 72% Please set up office visit in person or virtually to discuss sleep study results and treatment plan.  May double book

## 2022-04-21 NOTE — Telephone Encounter (Signed)
Called and spoke with patient, advised of results/recommendations per Rubye Oaks NP.  He verbalized understanding.  Scheduled patient for a mychart visit on 04/28/22 at 11:30 am, advised to start logging on at 11:15 am in case he has any technical difficulties.  Nothing further needed.

## 2022-04-28 ENCOUNTER — Telehealth (INDEPENDENT_AMBULATORY_CARE_PROVIDER_SITE_OTHER): Payer: Managed Care, Other (non HMO) | Admitting: Adult Health

## 2022-04-28 ENCOUNTER — Encounter: Payer: Self-pay | Admitting: Adult Health

## 2022-04-28 VITALS — HR 82

## 2022-04-28 DIAGNOSIS — G4733 Obstructive sleep apnea (adult) (pediatric): Secondary | ICD-10-CM

## 2022-04-28 NOTE — Patient Instructions (Signed)
Begin CPAP at bedtime.  Goal is to wear your CPAP all night long for at least 6 or more hours. Healthy sleep regimen Do not drive if sleepy Work on healthy weight Follow-up in 3 months and As needed

## 2022-04-28 NOTE — Progress Notes (Signed)
Virtual Visit via Video Note  I connected with Jonathan Oconnor on 04/28/22 at 11:30 AM EDT by a video enabled telemedicine application and verified that I am speaking with the correct person using two identifiers.  Location: Patient: Home  Provider: Office    I discussed the limitations of evaluation and management by telemedicine and the availability of in person appointments. The patient expressed understanding and agreed to proceed.  History of Present Illness: 46 year old male seen for sleep consult February 24, 2022 for snoring, daytime sleepiness and restless sleep set up for home sleep study that showed moderate obstructive sleep apnea  Today's virtual visit is a follow-up to discuss sleep study results.  Patient was seen for sleep consult in June for snoring and daytime sleepiness.  Home sleep study showed moderate sleep apnea.  See results below.  We discussed his sleep study results in detail.  Patient education was given.  We discussed treatment options including weight loss, oral appliance and CPAP.  With his degree of sleep apnea and symptom burden.  We will proceed with CPAP therapy.  Patient education was given on CPAP.  Past Medical History:  Diagnosis Date   Allergic rhinitis    Chicken pox    Epididymitis 03/27/04   ED visit [facility]   Hypertension   .  Current Outpatient Medications on File Prior to Visit  Medication Sig Dispense Refill   cetirizine (ZYRTEC) 10 MG tablet TAKE 1 TABLET BY MOUTH EVERY DAY AS NEEDED FOR ALLERGY 30 tablet 11   fluticasone (FLONASE) 50 MCG/ACT nasal spray PLACE 2 SPRAYS INTO BOTH NOSTRILS DAILY AS NEEDED FOR ALLERGIES OR RHINITIS. 16 mL 5   tadalafil (CIALIS) 5 MG tablet Take 1 tablet (5 mg total) by mouth daily. 90 tablet 11   valsartan-hydrochlorothiazide (DIOVAN-HCT) 160-25 MG tablet TAKE 1 TABLET BY MOUTH EVERY DAY 90 tablet 3   No current facility-administered medications on file prior to visit.      Observations/Objective: Home  sleep study done on March 24, 2022 showed moderate obstructive sleep apnea with AHI at 26/hour and SPO2 low at 72%  Assessment and Plan: Moderate obstructive sleep apnea.  We will begin CPAP therapy. We will begin CPAP at 5 to 15 cm H2O. He wants to try the DreamWear nasal mask.  Plan  Patient Instructions  Begin CPAP at bedtime.  Goal is to wear your CPAP all night long for at least 6 or more hours. Healthy sleep regimen Do not drive if sleepy Work on healthy weight Follow-up in 3 months and As needed       Follow Up Instructions:    I discussed the assessment and treatment plan with the patient. The patient was provided an opportunity to ask questions and all were answered. The patient agreed with the plan and demonstrated an understanding of the instructions.   The patient was advised to call back or seek an in-person evaluation if the symptoms worsen or if the condition fails to improve as anticipated.  I provided 22 minutes of non-face-to-face time during this encounter.   Rubye Oaks, NP

## 2022-07-31 ENCOUNTER — Encounter: Payer: Self-pay | Admitting: Adult Health

## 2022-07-31 ENCOUNTER — Telehealth (INDEPENDENT_AMBULATORY_CARE_PROVIDER_SITE_OTHER): Payer: Managed Care, Other (non HMO) | Admitting: Adult Health

## 2022-07-31 DIAGNOSIS — G4733 Obstructive sleep apnea (adult) (pediatric): Secondary | ICD-10-CM

## 2022-07-31 NOTE — Addendum Note (Signed)
Addended by: Delrae Rend on: 07/31/2022 09:24 AM   Modules accepted: Orders

## 2022-07-31 NOTE — Progress Notes (Signed)
Virtual Visit via Video Note  I connected with Corey Harold on 07/31/22 at  8:30 AM EST by a video enabled telemedicine application and verified that I am speaking with the correct person using two identifiers.  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 The patient expressed understanding and agreed to proceed.  History of Present Illness: 46 year old male seen for sleep consult February 24, 2022 for snoring, daytime sleepiness and restless sleep set up for home sleep study that showed moderate obstructive sleep apnea  Today's video visit is a 39-month follow-up for sleep apnea.  Patient was recently started on CPAP.  Patient says he is starting to adjust to CPAP.  Feels that it is starting to help with decreased daytime sleepiness.  Occasionally falls asleep and forgets to put on but has been wearing it for the majority of nights.  Feels that his daytime sleepiness has decreased.  Feels that he benefits from CPAP.  Patient is using a fullface mask.  CPAP download shows 60% compliance.  Daily average usage at 6.5 hours.  Patient is on auto CPAP 5 to 15 cm H2O.  Daily average pressure at 14.9 cm H2O.  AHI 6.2/hour.  Past Medical History:  Diagnosis Date   Allergic rhinitis    Chicken pox    Epididymitis 03/27/04   ED [facility]   Hypertension    Current Outpatient Medications on File Prior to Visit  Medication Sig Dispense Refill   cetirizine (ZYRTEC) 10 MG tablet TAKE 1 TABLET BY MOUTH EVERY DAY AS NEEDED FOR ALLERGY 30 tablet 11   fluticasone (FLONASE) 50 MCG/ACT nasal spray PLACE 2 SPRAYS INTO BOTH NOSTRILS DAILY AS NEEDED FOR ALLERGIES OR RHINITIS. 16 mL 5   tadalafil (CIALIS) 5 MG tablet Take 1 tablet (5 mg total) by mouth daily. 90 tablet 11   valsartan-hydrochlorothiazide (DIOVAN-HCT) 160-25 MG tablet TAKE 1 TABLET BY MOUTH EVERY DAY 90 tablet  3   No current facility-administered medications on file prior to visit.      Observations/Objective: Home sleep study done on March 24, 2022 showed moderate obstructive sleep apnea with AHI at 26/hour and SPO2 low at 72%   Assessment and Plan: Moderate obstructive sleep apnea with improved control on CPAP.  Perceived benefit on CPAP. We will adjust CPAP pressure to decrease number of events.  Change to auto CPAP 5 to 18 cm H2O.  Plan  Patient Instructions  Wear CPAP all night long at bedtime.  Goal is to wear your CPAP all night long for at least 6 or more hours. We will adjust her CPAP pressure.  Healthy sleep regimen Do not drive if sleepy Work on healthy weight Follow-up in 4-6 months with Brycin Kille NP or Dr. Craige Cotta and As needed     .  Follow Up Instructions:    I discussed the assessment and treatment plan with the patient. The patient was provided an opportunity to ask questions and all were answered. The patient agreed with the plan and demonstrated an understanding of the instructions.   The patient was advised to call back or seek an in-person evaluation if the symptoms worsen or if the condition fails to improve as anticipated.  I provided 21  minutes of non-face-to-face time during this encounter.   Rubye Oaks, NP

## 2022-07-31 NOTE — Patient Instructions (Addendum)
Wear CPAP all night long at bedtime.  Goal is to wear your CPAP all night long for at least 6 or more hours. We will adjust her CPAP pressure.  Healthy sleep regimen Do not drive if sleepy Work on healthy weight Follow-up in 4-6 months with Richardo Popoff NP or Dr. Craige Cotta and As needed

## 2022-07-31 NOTE — Progress Notes (Signed)
Reviewed and agree with assessment/plan.   Coralyn Helling, MD [facility] 07/31/2022, 10:29 AM Pager:  [phone]

## 2022-12-22 ENCOUNTER — Encounter: Payer: Managed Care, Other (non HMO) | Admitting: Internal Medicine

## 2022-12-26 ENCOUNTER — Encounter: Payer: Managed Care, Other (non HMO) | Admitting: Internal Medicine

## 2022-12-28 ENCOUNTER — Ambulatory Visit (INDEPENDENT_AMBULATORY_CARE_PROVIDER_SITE_OTHER): Payer: Managed Care, Other (non HMO) | Admitting: Internal Medicine

## 2022-12-28 ENCOUNTER — Encounter: Payer: Self-pay | Admitting: Internal Medicine

## 2022-12-28 VITALS — BP 132/88 | HR 80 | Temp 97.9°F | Ht 67.5 in | Wt 246.0 lb

## 2022-12-28 DIAGNOSIS — Z0000 Encounter for general adult medical examination without abnormal findings: Secondary | ICD-10-CM

## 2022-12-28 DIAGNOSIS — N401 Enlarged prostate with lower urinary tract symptoms: Secondary | ICD-10-CM

## 2022-12-28 DIAGNOSIS — I10 Essential (primary) hypertension: Secondary | ICD-10-CM | POA: Diagnosis not present

## 2022-12-28 DIAGNOSIS — G4733 Obstructive sleep apnea (adult) (pediatric): Secondary | ICD-10-CM | POA: Diagnosis not present

## 2022-12-28 LAB — COMPREHENSIVE METABOLIC PANEL
ALT: 24 U/L (ref 0–53)
AST: 23 U/L (ref 0–37)
Albumin: 4.3 g/dL (ref 3.5–5.2)
Alkaline Phosphatase: 71 U/L (ref 39–117)
BUN: 13 mg/dL (ref 6–23)
CO2: 31 mEq/L (ref 19–32)
Calcium: 9.1 mg/dL (ref 8.4–10.5)
Chloride: 100 mEq/L (ref 96–112)
Creatinine, Ser: 1.07 mg/dL (ref 0.40–1.50)
GFR: 83.98 mL/min (ref >60.00)
Glucose, Bld: 97 mg/dL (ref 70–99)
Potassium: 3.7 mEq/L (ref 3.5–5.1)
Sodium: 139 mEq/L (ref 135–145)
Total Bilirubin: 1.6 mg/dL — ABNORMAL HIGH (ref 0.2–1.2)
Total Protein: 7.8 g/dL (ref 6.0–8.3)

## 2022-12-28 LAB — LIPID PANEL
Cholesterol: 164 mg/dL (ref 0–200)
HDL: 52.5 mg/dL (ref >39.00)
LDL Cholesterol: 99 mg/dL (ref 0–99)
NonHDL: 111.71
Total CHOL/HDL Ratio: 3
Triglycerides: 66 mg/dL (ref 0.0–149.0)
VLDL: 13.2 mg/dL (ref 0.0–40.0)

## 2022-12-28 LAB — CBC
HCT: 40.8 % (ref 39.0–52.0)
Hemoglobin: 14.1 g/dL (ref 13.0–17.0)
MCHC: 34.7 g/dL (ref 30.0–36.0)
MCV: 83 fl (ref 78.0–100.0)
Platelets: 175 10*3/uL (ref 150.0–400.0)
RBC: 4.92 Mil/uL (ref 4.22–5.81)
RDW: 13.7 % (ref 11.5–15.5)
WBC: 4.4 10*3/uL (ref 4.0–10.5)

## 2022-12-28 MED ORDER — TRIAMCINOLONE ACETONIDE 0.1 % EX CREA: 1.0000 | TOPICAL_CREAM | Freq: Two times a day (BID) | CUTANEOUS | 1 refills | Status: AC | PRN

## 2022-12-28 MED ORDER — FLUTICASONE PROPIONATE 50 MCG/ACT NA SUSP: 2.0000 | Freq: Every day | NASAL | 5 refills | Status: AC | PRN

## 2022-12-28 MED ORDER — CETIRIZINE HCL 10 MG PO TABS: ORAL_TABLET | ORAL | 3 refills | Status: AC

## 2022-12-28 NOTE — Assessment & Plan Note (Signed)
BP Readings from Last 3 Encounters:  12/28/22 132/88  02/24/22 140/86  12/16/21 120/74   Controlled on the valsartan/HCTZ 160/25 daily Will check labs

## 2022-12-28 NOTE — Assessment & Plan Note (Signed)
Doing well with CPAP. 

## 2022-12-28 NOTE — Assessment & Plan Note (Signed)
Healthy Has lost some weight Discussed more exercise COVID update vaccine soon  Consider flu vaccine in the fall

## 2022-12-28 NOTE — Progress Notes (Signed)
Subjective:    Patient ID: Jonathan Oconnor, male    DOB: 01-Jun-1976, 47 y.o.   MRN: [id number]  HPI Here for physical  Did have sleep study and now on CPAP  Having some AM dizziness in the morning--usually after break in AM (9AM) Very brief--like seconds. No syncope. Occasionally has to sit back down Doesn't eat breakfast---first thing he eats is at the break Takes BP med in morning  Has lost some weight--6# in past year, 20# in 2 years Eating better Not really exercising--just some weedeating  Current Outpatient Medications on File Prior to Visit  Medication Sig Dispense Refill   cetirizine (ZYRTEC) 10 MG tablet TAKE 1 TABLET BY MOUTH EVERY DAY AS NEEDED FOR ALLERGY 30 tablet 11   fluticasone (FLONASE) 50 MCG/ACT nasal spray PLACE 2 SPRAYS INTO BOTH NOSTRILS DAILY AS NEEDED FOR ALLERGIES OR RHINITIS. 16 mL 5   tadalafil (CIALIS) 5 MG tablet Take 1 tablet (5 mg total) by mouth daily. 90 tablet 11   valsartan-hydrochlorothiazide (DIOVAN-HCT) 160-25 MG tablet TAKE 1 TABLET BY MOUTH EVERY DAY 90 tablet 3   No current facility-administered medications on file prior to visit.    Allergies  Allergen Reactions   Avocado Other (See Comments)    Sore throat   Other Nausea And Vomiting    Melons    Past Medical History:  Diagnosis Date   Allergic rhinitis    Chicken pox    Epididymitis 03/27/04   ED visit [facility]   Hypertension     Past Surgical History:  Procedure Laterality Date   KNEE SURGERY Left 1982   surgery for ? infection in his knee    Family History  Problem Relation Age of Onset   Hypertension Mother    Diabetes Mother        prediabetes   Cancer Maternal Grandfather        prostate   Heart disease Maternal Grandfather    Cancer Paternal Grandfather        prostate   Stroke Paternal Grandfather    Prostate cancer Father    Alcohol abuse Father     Social History   Socioeconomic History   Marital status: Married    Spouse name: Tangela    Number of children: 2   Years of education: 12   Highest education level: Not on file  Occupational History   Occupation: Midwife: [employer]    Comment:    Tobacco Use   Smoking status: Former    Packs/day: 0.25    Years: 10.00    Additional pack years: 0.00    Total pack years: 2.50    Types: Cigarettes    Quit date: 2018    Years since quitting: 6.3    Passive exposure: Past   Smokeless tobacco: Never  Substance and Sexual Activity   Alcohol use: Yes    Comment: Occasionally    Drug use: No   Sexual activity: Not on file  Other Topics Concern   Not on file  Social History Narrative   Dwaine grew up in [place]. He is currently living in [place] with his wife [name]), daughter [name]) and his father-in-law. Step daughter --with his grandchild. He enjoys fishing on his spare time.    Social Determinants of Health   Financial Resource Strain: Not on file  Food Insecurity: Not on file  Transportation Needs: Not on file  Physical Activity: Not on file  Stress: Not on file  Social Connections: Not on file  Intimate Partner Violence: Not on file   Review of Systems  Constitutional:  Negative for fatigue and unexpected weight change.       Wears seat belt mostly  HENT:  Negative for dental problem, hearing loss and tinnitus.        Keeps up with dentist  Eyes:  Negative for visual disturbance.       Some trouble reading--no diplopia   Respiratory:  Negative for cough, chest tightness and shortness of breath.   Cardiovascular:  Negative for chest pain, palpitations and leg swelling.  Gastrointestinal:  Negative for abdominal pain, blood in stool and constipation.       No heartburn  Endocrine: Negative for polydipsia and polyuria.  Genitourinary:  Negative for difficulty urinating and urgency.       Not as frequent --though only using the cialis prn (2-3 times a week)  Skin:  Negative for rash.       No suspicious lesions   Allergic/Immunologic: Positive for environmental allergies. Negative for immunocompromised state.       Cetirizine helps  Neurological:  Positive for dizziness. Negative for syncope and headaches.  Hematological:  Negative for adenopathy. Bruises/bleeds easily.  Psychiatric/Behavioral:  Negative for dysphoric mood and sleep disturbance. The patient is not nervous/anxious.        Objective:   Physical Exam Constitutional:      Appearance: Normal appearance.  HENT:     Mouth/Throat:     Pharynx: No oropharyngeal exudate or posterior oropharyngeal erythema.  Eyes:     Conjunctiva/sclera: Conjunctivae normal.     Pupils: Pupils are equal, round, and reactive to light.  Cardiovascular:     Rate and Rhythm: Normal rate and regular rhythm.     Pulses: Normal pulses.     Heart sounds:     No gallop.  Pulmonary:     Effort: Pulmonary effort is normal.     Breath sounds: Normal breath sounds. No wheezing or rales.  Abdominal:     Palpations: Abdomen is soft.     Tenderness: There is no abdominal tenderness.  Musculoskeletal:     Cervical back: Neck supple.     Right lower leg: No edema.     Left lower leg: No edema.  Lymphadenopathy:     Cervical: No cervical adenopathy.  Skin:    Findings: No lesion.     Comments: Mild contact rash on face  Neurological:     General: No focal deficit present.     Mental Status: He is alert and oriented to person, place, and time.  Psychiatric:        Mood and Affect: Mood normal.        Behavior: Behavior normal.            Assessment & Plan:

## 2022-12-28 NOTE — Assessment & Plan Note (Signed)
Voiding better with tadalafil  several times a week

## 2023-11-19 ENCOUNTER — Encounter: Payer: Self-pay | Admitting: Adult Health

## 2023-12-31 ENCOUNTER — Other Ambulatory Visit: Payer: Self-pay | Admitting: Internal Medicine

## 2023-12-31 ENCOUNTER — Encounter: Payer: Self-pay | Admitting: Internal Medicine

## 2023-12-31 ENCOUNTER — Ambulatory Visit (INDEPENDENT_AMBULATORY_CARE_PROVIDER_SITE_OTHER): Payer: Managed Care, Other (non HMO) | Admitting: Internal Medicine

## 2023-12-31 VITALS — BP 164/100 | HR 73 | Temp 97.8°F | Ht 67.5 in | Wt 264.0 lb

## 2023-12-31 DIAGNOSIS — Z114 Encounter for screening for human immunodeficiency virus [HIV]: Secondary | ICD-10-CM | POA: Diagnosis not present

## 2023-12-31 DIAGNOSIS — I10 Essential (primary) hypertension: Secondary | ICD-10-CM | POA: Diagnosis not present

## 2023-12-31 DIAGNOSIS — G4733 Obstructive sleep apnea (adult) (pediatric): Secondary | ICD-10-CM

## 2023-12-31 DIAGNOSIS — Z0000 Encounter for general adult medical examination without abnormal findings: Secondary | ICD-10-CM | POA: Diagnosis not present

## 2023-12-31 DIAGNOSIS — Z1159 Encounter for screening for other viral diseases: Secondary | ICD-10-CM | POA: Diagnosis not present

## 2023-12-31 DIAGNOSIS — Z1211 Encounter for screening for malignant neoplasm of colon: Secondary | ICD-10-CM

## 2023-12-31 DIAGNOSIS — N401 Enlarged prostate with lower urinary tract symptoms: Secondary | ICD-10-CM

## 2023-12-31 MED ORDER — VALSARTAN 320 MG PO TABS: 320.0000 mg | ORAL_TABLET | Freq: Every day | ORAL | 3 refills | Status: AC

## 2023-12-31 NOTE — Assessment & Plan Note (Signed)
 Uses the cialis  every other day

## 2023-12-31 NOTE — Assessment & Plan Note (Signed)
 Now using a mouth guard--and no sig daytime somnolence

## 2023-12-31 NOTE — Assessment & Plan Note (Signed)
 Discussed getting back to healthier eating to drop weight again Will set up for colonoscopy Prefers no flu/COVID vaccines (but I recommended) Fitness other than the yard work job

## 2023-12-31 NOTE — Progress Notes (Signed)
 Subjective:    Patient ID: Jonathan Oconnor, male    DOB: 1975-11-07, 48 y.o.   MRN: [id number]  HPI Here for physical  Not taking his BP med every day----has to run to the bathroom after taking it Drinks a lot of water Doesn't add salt Doesn't check blood pressure at home No exercise--only does weedeating (does yard work on the side) Has gained back a lot of the weight he lost  Current Outpatient Medications on File Prior to Visit  Medication Sig Dispense Refill   cetirizine  (ZYRTEC ) 10 MG tablet TAKE 1 TABLET BY MOUTH EVERY DAY AS NEEDED FOR ALLERGY 90 tablet 3   fluticasone  (FLONASE ) 50 MCG/ACT nasal spray Place 2 sprays into both nostrils daily as needed for allergies or rhinitis. 16 mL 5   tadalafil  (CIALIS ) 5 MG tablet Take 1 tablet (5 mg total) by mouth daily. 90 tablet 11   triamcinolone  cream (KENALOG ) 0.1 % Apply 1 Application topically 2 (two) times daily as needed. 45 g 1   valsartan -hydrochlorothiazide  (DIOVAN -HCT) 160-25 MG tablet TAKE 1 TABLET BY MOUTH EVERY DAY 90 tablet 3   No current facility-administered medications on file prior to visit.    Allergies  Allergen Reactions   Avocado Other (See Comments)    Sore throat   Other Nausea And Vomiting    Melons    Past Medical History:  Diagnosis Date   Allergic rhinitis    Chicken pox    Epididymitis 03/27/04   ED visit [facility]   Hypertension     Past Surgical History:  Procedure Laterality Date   KNEE SURGERY Left 1982   surgery for ? infection in his knee    Family History  Problem Relation Age of Onset   Hypertension Mother    Diabetes Mother        prediabetes   Cancer Maternal Grandfather        prostate   Heart disease Maternal Grandfather    Cancer Paternal Grandfather        prostate   Stroke Paternal Grandfather    Prostate cancer Father    Alcohol abuse Father     Social History   Socioeconomic History   Marital status: Married    Spouse name: Tangela   Number of children: 2    Years of education: 12   Highest education level: Not on file  Occupational History   Occupation: Midwife: [employer]    Comment:    Tobacco Use   Smoking status: Former    Current packs/day: 0.00    Average packs/day: 0.3 packs/day for 10.0 years (2.5 ttl pk-yrs)    Types: Cigarettes    Start date: 2008    Quit date: 2018    Years since quitting: 7.3    Passive exposure: Past   Smokeless tobacco: Never  Substance and Sexual Activity   Alcohol use: Yes    Comment: Occasionally    Drug use: No   Sexual activity: Not on file  Other Topics Concern   Not on file  Social History Narrative   Deyonte grew up in [place]. He is currently living in [place] with his wife Claudis Cumber), daughter Apolonio Kluver) and his father-in-law. Step daughter --with his grandchild. He enjoys fishing on his spare time.    Social Drivers of Corporate investment banker Strain: Not on file  Food Insecurity: Not on file  Transportation Needs: Not on file  Physical Activity: Not on file  Stress:  Not on file  Social Connections: Not on file  Intimate Partner Violence: Not on file   Review of Systems  Constitutional:  Positive for unexpected weight change. Negative for fatigue.       Wears seat belt  HENT:  Negative for dental problem, hearing loss and tinnitus.        Keeps up with dentist  Eyes:  Negative for visual disturbance.        No diplopia or unilateral vision loss  Respiratory:  Negative for cough, chest tightness and shortness of breath.   Cardiovascular:  Negative for chest pain, palpitations and leg swelling.  Gastrointestinal:  Negative for blood in stool and constipation.       No heartburn  Endocrine: Negative for polydipsia and polyuria.  Genitourinary:  Positive for frequency. Negative for difficulty urinating and urgency.  Musculoskeletal:  Negative for arthralgias, back pain and joint swelling.       Some swelling in right wrist--?from weedeating  Skin:   Negative for rash.  Allergic/Immunologic: Positive for environmental allergies. Negative for immunocompromised state.  Neurological:  Negative for syncope and light-headedness.       One dizzy spell--better after drinking some water Occasional headaches  Hematological:  Negative for adenopathy. Does not bruise/bleed easily.  Psychiatric/Behavioral:  Negative for dysphoric mood and sleep disturbance. The patient is not nervous/anxious.        Didn't like CPAP---using mouth guard        Objective:   Physical Exam Constitutional:      Appearance: Normal appearance.  HENT:     Mouth/Throat:     Pharynx: No oropharyngeal exudate or posterior oropharyngeal erythema.  Eyes:     Conjunctiva/sclera: Conjunctivae normal.     Pupils: Pupils are equal, round, and reactive to light.  Cardiovascular:     Rate and Rhythm: Normal rate and regular rhythm.     Pulses: Normal pulses.     Heart sounds: No murmur heard.    No gallop.  Pulmonary:     Effort: Pulmonary effort is normal.     Breath sounds: Normal breath sounds. No wheezing or rales.  Abdominal:     Palpations: Abdomen is soft.     Tenderness: There is no abdominal tenderness.  Musculoskeletal:     Cervical back: Neck supple.     Right lower leg: No edema.     Left lower leg: No edema.  Lymphadenopathy:     Cervical: No cervical adenopathy.  Skin:    Findings: No lesion or rash.  Neurological:     General: No focal deficit present.     Mental Status: He is alert and oriented to person, place, and time.  Psychiatric:        Mood and Affect: Mood normal.        Behavior: Behavior normal.            Assessment & Plan:

## 2023-12-31 NOTE — Assessment & Plan Note (Signed)
 BP Readings from Last 3 Encounters:  12/31/23 (!) 164/100  12/28/22 132/88  02/24/22 140/86   Hasn't been regular with meds Will change to valsartan  320mg  daily Recheck 4-6 weeks

## 2024-02-04 ENCOUNTER — Ambulatory Visit (INDEPENDENT_AMBULATORY_CARE_PROVIDER_SITE_OTHER): Admitting: Internal Medicine

## 2024-02-04 VITALS — BP 158/96 | HR 88 | Temp 98.6°F | Ht 67.5 in | Wt 263.0 lb

## 2024-02-04 DIAGNOSIS — I10 Essential (primary) hypertension: Secondary | ICD-10-CM

## 2024-02-04 MED ORDER — AMLODIPINE BESYLATE 5 MG PO TABS: 5.0000 mg | ORAL_TABLET | Freq: Every day | ORAL | 3 refills | Status: AC

## 2024-02-04 NOTE — Progress Notes (Signed)
 Subjective:    Patient ID: Jonathan Oconnor, male    DOB: [birth year], 48 y.o.   MRN: [id number]  HPI Here for follow up of HTN  Doing better with the new medication (without the hydrochlorothiazide ) Hasn't been checking BP No chest pain or SOB Occasional sinus headache in AM No edema  Current Outpatient Medications on File Prior to Visit  Medication Sig Dispense Refill   cetirizine  (ZYRTEC ) 10 MG tablet TAKE 1 TABLET BY MOUTH EVERY DAY AS NEEDED FOR ALLERGY 90 tablet 3   fluticasone  (FLONASE ) 50 MCG/ACT nasal spray Place 2 sprays into both nostrils daily as needed for allergies or rhinitis. 16 mL 5   tadalafil  (CIALIS ) 5 MG tablet TAKE ONE TABLET BY MOUTH ONE TIME DAILY 90 tablet 3   triamcinolone  cream (KENALOG ) 0.1 % Apply 1 Application topically 2 (two) times daily as needed. 45 g 1   valsartan  (DIOVAN ) 320 MG tablet Take 1 tablet (320 mg total) by mouth daily. 90 tablet 3   No current facility-administered medications on file prior to visit.    Allergies  Allergen Reactions   Avocado Other (See Comments)    Sore throat   Other Nausea And Vomiting    Melons    Past Medical History:  Diagnosis Date   Allergic rhinitis    Chicken pox    Epididymitis 03/27/04   ED visit [facility]   Hypertension     Past Surgical History:  Procedure Laterality Date   KNEE SURGERY Left 1982   surgery for ? infection in his knee    Family History  Problem Relation Age of Onset   Hypertension Mother    Diabetes Mother        prediabetes   Cancer Maternal Grandfather        prostate   Heart disease Maternal Grandfather    Cancer Paternal Grandfather        prostate   Stroke Paternal Grandfather    Prostate cancer Father    Alcohol abuse Father     Social History   Socioeconomic History   Marital status: Married    Spouse name: Tangela   Number of children: 2   Years of education: 12   Highest education level: Some college, no degree  Occupational History   Occupation:  Midwife: [employer]    Comment:    Tobacco Use   Smoking status: Former    Current packs/day: 0.00    Average packs/day: 0.3 packs/day for 10.0 years (2.5 ttl pk-yrs)    Types: Cigarettes    Start date: 2008    Quit date: 2018    Years since quitting: 7.4    Passive exposure: Past   Smokeless tobacco: Never  Substance and Sexual Activity   Alcohol use: Yes    Comment: Occasionally    Drug use: No   Sexual activity: Not on file  Other Topics Concern   Not on file  Social History Narrative   Namish grew up in [place]. He is currently living in [place] with his wife Claudis Cumber), daughter Apolonio Kluver) and his father-in-law. Step daughter --with his grandchild. He enjoys fishing on his spare time.    Social Drivers of Corporate investment banker Strain: Low Risk  (02/04/2024)   Overall Financial Resource Strain (CARDIA)    Difficulty of Paying Living Expenses: Not very hard  Food Insecurity: No Food Insecurity (02/04/2024)   Hunger Vital Sign    Worried About Running Out of  Food in the Last Year: Never true    Ran Out of Food in the Last Year: Never true  Transportation Needs: No Transportation Needs (02/04/2024)   PRAPARE - Administrator, Civil Service (Medical): No    Lack of Transportation (Non-Medical): No  Physical Activity: Insufficiently Active (02/04/2024)   Exercise Vital Sign    Days of Exercise per Week: 3 days    Minutes of Exercise per Session: 30 min  Stress: No Stress Concern Present (02/04/2024)   [employer] of Occupational Health - Occupational Stress Questionnaire    Feeling of Stress : Only a little  Social Connections: Socially Integrated (02/04/2024)   Social Connection and Isolation Panel [NHANES]    Frequency of Communication with Friends and Family: More than three times a week    Frequency of Social Gatherings with Friends and Family: More than three times a week    Attends Religious Services: 1 to 4 times per year     Active Member of [employer] or Organizations: Yes    Attends Banker Meetings: 1 to 4 times per year    Marital Status: Married  Catering manager Violence: Not on file   Review of Systems Tries to avoid salt Has been trying to eat better     Objective:   Physical Exam Constitutional:      Appearance: Normal appearance.  Cardiovascular:     Rate and Rhythm: Normal rate and regular rhythm.     Heart sounds: No murmur heard.    No gallop.  Pulmonary:     Effort: Pulmonary effort is normal.     Breath sounds: Normal breath sounds. No wheezing or rales.  Musculoskeletal:     Cervical back: Neck supple.     Right lower leg: No edema.     Left lower leg: No edema.  Lymphadenopathy:     Cervical: No cervical adenopathy.  Neurological:     Mental Status: He is alert.            Assessment & Plan:

## 2024-02-04 NOTE — Assessment & Plan Note (Signed)
 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

## 2024-04-07 ENCOUNTER — Ambulatory Visit (INDEPENDENT_AMBULATORY_CARE_PROVIDER_SITE_OTHER): Admitting: Internal Medicine

## 2024-04-07 ENCOUNTER — Ambulatory Visit: Payer: Self-pay | Admitting: Internal Medicine

## 2024-04-07 ENCOUNTER — Encounter: Payer: Self-pay | Admitting: Internal Medicine

## 2024-04-07 VITALS — BP 138/84 | HR 69 | Temp 98.6°F | Ht 67.5 in | Wt 262.0 lb

## 2024-04-07 DIAGNOSIS — I10 Essential (primary) hypertension: Secondary | ICD-10-CM

## 2024-04-07 DIAGNOSIS — L600 Ingrowing nail: Secondary | ICD-10-CM | POA: Insufficient documentation

## 2024-04-07 DIAGNOSIS — J301 Allergic rhinitis due to pollen: Secondary | ICD-10-CM | POA: Diagnosis not present

## 2024-04-07 LAB — LIPID PANEL
Cholesterol: 168 mg/dL (ref 0–200)
HDL: 52.4 mg/dL (ref >39.00)
LDL Cholesterol: 106 mg/dL — ABNORMAL HIGH (ref 0–99)
NonHDL: 115.95
Total CHOL/HDL Ratio: 3
Triglycerides: 50 mg/dL (ref 0.0–149.0)
VLDL: 10 mg/dL (ref 0.0–40.0)

## 2024-04-07 LAB — COMPREHENSIVE METABOLIC PANEL WITH GFR
ALT: 15 U/L (ref 0–53)
AST: 16 U/L (ref 0–37)
Albumin: 4.2 g/dL (ref 3.5–5.2)
Alkaline Phosphatase: 72 U/L (ref 39–117)
BUN: 12 mg/dL (ref 6–23)
CO2: 30 meq/L (ref 19–32)
Calcium: 8.7 mg/dL (ref 8.4–10.5)
Chloride: 101 meq/L (ref 96–112)
Creatinine, Ser: 0.99 mg/dL (ref 0.40–1.50)
GFR: 91.37 mL/min (ref >60.00)
Glucose, Bld: 107 mg/dL — ABNORMAL HIGH (ref 70–99)
Potassium: 3.7 meq/L (ref 3.5–5.1)
Sodium: 139 meq/L (ref 135–145)
Total Bilirubin: 1.1 mg/dL (ref 0.2–1.2)
Total Protein: 7.3 g/dL (ref 6.0–8.3)

## 2024-04-07 LAB — CBC
HCT: 41.3 % (ref 39.0–52.0)
Hemoglobin: 14.1 g/dL (ref 13.0–17.0)
MCHC: 34.1 g/dL (ref 30.0–36.0)
MCV: 82.3 fl (ref 78.0–100.0)
Platelets: 191 K/uL (ref 150.0–400.0)
RBC: 5.03 Mil/uL (ref 4.22–5.81)
RDW: 13.8 % (ref 11.5–15.5)
WBC: 5.4 K/uL (ref 4.0–10.5)

## 2024-04-07 NOTE — Assessment & Plan Note (Signed)
 Just distal on great toenails Discussed trimming May need to check fit on his work boots

## 2024-04-07 NOTE — Assessment & Plan Note (Signed)
 BP Readings from Last 3 Encounters:  04/07/24 138/84  02/04/24 (!) 158/96  12/31/23 (!) 164/100   Reasonable control on amlodipine  5 and valsartan  320 Will check labs

## 2024-04-07 NOTE — Assessment & Plan Note (Signed)
 Gets some sinus headaches Uses the OTC and excedrin prn

## 2024-04-07 NOTE — Progress Notes (Signed)
 Subjective:    Patient ID: Jonathan Oconnor, male    DOB: 07/18/1976, 48 y.o.   MRN: [id number]  HPI Here for follow up of hypertension and other chronic health conditions  Doing well Checks BP occasionally---was up once Occ headaches---but relates to weather coming in No chest pain or SOB No dizziness or syncope Does yard work---no other exercise  Has pain at end of great toenails on both sides No swelling/redness  Current Outpatient Medications on File Prior to Visit  Medication Sig Dispense Refill   amLODipine  (NORVASC ) 5 MG tablet Take 1 tablet (5 mg total) by mouth daily. 90 tablet 3   cetirizine  (ZYRTEC ) 10 MG tablet TAKE 1 TABLET BY MOUTH EVERY DAY AS NEEDED FOR ALLERGY 90 tablet 3   fluticasone  (FLONASE ) 50 MCG/ACT nasal spray Place 2 sprays into both nostrils daily as needed for allergies or rhinitis. 16 mL 5   valsartan  (DIOVAN ) 320 MG tablet Take 1 tablet (320 mg total) by mouth daily. 90 tablet 3   tadalafil  (CIALIS ) 5 MG tablet TAKE ONE TABLET BY MOUTH ONE TIME DAILY (Patient not taking: Reported on 04/07/2024) 90 tablet 3   triamcinolone  cream (KENALOG ) 0.1 % Apply 1 Application topically 2 (two) times daily as needed. (Patient not taking: Reported on 04/07/2024) 45 g 1   No current facility-administered medications on file prior to visit.    Allergies  Allergen Reactions   Avocado Other (See Comments)    Sore throat   Other Nausea And Vomiting    Melons    Past Medical History:  Diagnosis Date   Allergic rhinitis    Chicken pox    Epididymitis 03/27/04   ED visit [facility]   Hypertension     Past Surgical History:  Procedure Laterality Date   KNEE SURGERY Left 1982   surgery for ? 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 He is currently living in [place] with his wife Donnia), daughter Theda) and his father-in-law. Step daughter --with his grandchild. He enjoys fishing on his spare time.    Social  Drivers of Corporate investment banker Strain: Low Risk  (04/07/2024)   Overall Financial Resource Strain (CARDIA)    Difficulty of Paying Living Expenses: Not hard at all  Food Insecurity: No Food Insecurity (04/07/2024)   Hunger Vital Sign    Worried About Running Out of Food in the Last Year: Never true    Ran Out of Food in the Last Year: Never true  Transportation Needs: No Transportation Needs (04/07/2024)   PRAPARE - Administrator, Civil Service (Medical): No    Lack of Transportation (Non-Medical): No  Physical Activity: Insufficiently Active (04/07/2024)   Exercise Vital Sign    Days of Exercise per Week: 3 days    Minutes of Exercise per Session: 30 min  Stress: No Stress Concern Present (04/07/2024)   [employer] of Occupational Health - Occupational Stress Questionnaire    Feeling of Stress: Not at all  Social Connections:  Socially Integrated (04/07/2024)   Social Connection and Isolation Panel    Frequency of Communication with Friends and Family: More than three times a week    Frequency of Social Gatherings with Friends and Family: Twice a week    Attends Religious Services: 1 to 4 times per year    Active Member of [employer] or Organizations: Yes    Attends Banker Meetings: 1 to 4 times per year    Marital Status: Married  Catering manager Violence: Not on file   Review of Systems Weight is about the same  Sleeps okay    Objective:   Physical Exam Constitutional:      Appearance: Normal appearance.  Cardiovascular:     Rate and Rhythm: Normal rate and regular rhythm.     Heart sounds: No murmur heard.    No gallop.  Pulmonary:     Effort: Pulmonary effort is normal.     Breath sounds: Normal breath sounds. No wheezing or rales.  Musculoskeletal:     Cervical back: Neck supple.     Right lower leg: No edema.     Left lower leg: No edema.  Lymphadenopathy:     Cervical: No cervical adenopathy.  Skin:    Comments: Slight distal ingrowing of great toenails medially only  Neurological:     Mental Status: He is alert.            Assessment & Plan:

## 2024-04-17 NOTE — H&P (Signed)
 Pre-Procedure H&P   Patient ID: Jonathan Oconnor is a 48 y.o. male.  Gastroenterology Provider: Elspeth Ozell Jungling, DO  Referring Provider: Dr. Liisa PCP: Jimmy Charlie FERNS, MD  Date: 04/18/2024  HPI Mr. Jonathan Oconnor is a 48 y.o. male who presents today for Colonoscopy for colorectal cancer screening .  Initial screening. No fhx crc or polyps  Daily bm w/o m/h/c/d  Cr 1.07 hgb 14.1 mcv 83 plt 175K   Past Medical History:  Diagnosis Date   Allergic rhinitis    Chicken pox    Epididymitis 03/27/04   ED visit [facility]   Hypertension     Past Surgical History:  Procedure Laterality Date   KNEE SURGERY Left 1982   surgery for ? infection in his knee    Family History No h/o GI disease or malignancy  Review of Systems  Constitutional:  Negative for activity change, appetite change, chills, diaphoresis, fatigue, fever and unexpected weight change.  HENT:  Negative for trouble swallowing and voice change.   Respiratory:  Negative for shortness of breath and wheezing.   Cardiovascular:  Negative for chest pain, palpitations and leg swelling.  Gastrointestinal:  Negative for abdominal distention, abdominal pain, anal bleeding, blood in stool, constipation, diarrhea, nausea and vomiting.  Musculoskeletal:  Negative for arthralgias and myalgias.  Skin:  Negative for color change and pallor.  Neurological:  Negative for dizziness, syncope and weakness.  Psychiatric/Behavioral:  Negative for confusion. The patient is not nervous/anxious.   All other systems reviewed and are negative.    Medications No current facility-administered medications on file prior to encounter.   Current Outpatient Medications on File Prior to Encounter  Medication Sig Dispense Refill   amLODipine  (NORVASC ) 5 MG tablet Take 1 tablet (5 mg total) by mouth daily. 90 tablet 3   cetirizine  (ZYRTEC ) 10 MG tablet TAKE 1 TABLET BY MOUTH EVERY DAY AS NEEDED FOR ALLERGY 90 tablet 3   fluticasone   (FLONASE ) 50 MCG/ACT nasal spray Place 2 sprays into both nostrils daily as needed for allergies or rhinitis. 16 mL 5   valsartan  (DIOVAN ) 320 MG tablet Take 1 tablet (320 mg total) by mouth daily. 90 tablet 3   tadalafil  (CIALIS ) 5 MG tablet TAKE ONE TABLET BY MOUTH ONE TIME DAILY (Patient not taking: Reported on 04/07/2024) 90 tablet 3   triamcinolone  cream (KENALOG ) 0.1 % Apply 1 Application topically 2 (two) times daily as needed. (Patient not taking: Reported on 04/07/2024) 45 g 1    Pertinent medications related to GI and procedure were reviewed by me with the patient prior to the procedure   Current Facility-Administered Medications:    0.9 %  sodium chloride  infusion, , Intravenous, Continuous, Jungling Elspeth Ozell, DO, Last Rate: 20 mL/hr at 04/18/24 1019, 20 mL/hr at 04/18/24 1019  sodium chloride  20 mL/hr (04/18/24 1019)       Allergies  Allergen Reactions   Avocado Other (See Comments)    Sore throat   Other Nausea And Vomiting    Melons   Allergies were reviewed by me prior to the procedure  Objective   Body mass index is 38.82 kg/m. Vitals:   04/18/24 1010  BP: (!) 145/95  Pulse: 64  Resp: 20  Temp: (!) 96.1 F (35.6 C)  TempSrc: Temporal  SpO2: 98%  Weight: 114.1 kg  Height: 5' 7.5 (1.715 m)     Physical Exam Vitals and nursing note reviewed.  Constitutional:      General: He is not in acute  distress.    Appearance: Normal appearance. He is obese. He is not ill-appearing, toxic-appearing or diaphoretic.  HENT:     Head: Normocephalic and atraumatic.     Nose: Nose normal.     Mouth/Throat:     Mouth: Mucous membranes are moist.     Pharynx: Oropharynx is clear.  Eyes:     General: No scleral icterus.    Extraocular Movements: Extraocular movements intact.  Cardiovascular:     Rate and Rhythm: Normal rate and regular rhythm.     Heart sounds: Normal heart sounds. No murmur heard.    No friction rub. No gallop.  Pulmonary:     Effort: Pulmonary  effort is normal. No respiratory distress.     Breath sounds: Normal breath sounds. No wheezing, rhonchi or rales.  Abdominal:     General: Bowel sounds are normal. There is no distension.     Palpations: Abdomen is soft.     Tenderness: There is no abdominal tenderness. There is no guarding or rebound.  Musculoskeletal:     Cervical back: Neck supple.     Right lower leg: No edema.     Left lower leg: No edema.  Skin:    General: Skin is warm and dry.     Coloration: Skin is not jaundiced or pale.  Neurological:     General: No focal deficit present.     Mental Status: He is alert and oriented to person, place, and time. Mental status is at baseline.  Psychiatric:        Mood and Affect: Mood normal.        Behavior: Behavior normal.        Thought Content: Thought content normal.        Judgment: Judgment normal.      Assessment:  Mr. Jonathan Oconnor is a 49 y.o. male  who presents today for Colonoscopy for colorectal cancer screening .  Plan:  Colonoscopy with possible intervention today  Colonoscopy with possible biopsy, control of bleeding, polypectomy, and interventions as necessary has been discussed with the patient/patient representative. Informed consent was obtained from the patient/patient representative after explaining the indication, nature, and risks of the procedure including but not limited to death, bleeding, perforation, missed neoplasm/lesions, cardiorespiratory compromise, and reaction to medications. Opportunity for questions was given and appropriate answers were provided. Patient/patient representative has verbalized understanding is amenable to undergoing the procedure.   Elspeth Ozell Jungling, DO  [facility]  Portions of the record may have been created with voice recognition software. Occasional wrong-word or 'sound-a-like' substitutions may have occurred due to the inherent limitations of voice recognition software.  Read the chart  carefully and recognize, using context, where substitutions may have occurred.

## 2024-04-18 ENCOUNTER — Ambulatory Visit: Admitting: Certified Registered"

## 2024-04-18 ENCOUNTER — Encounter: Payer: Self-pay | Admitting: Gastroenterology

## 2024-04-18 ENCOUNTER — Ambulatory Visit
Admission: RE | Admit: 2024-04-18 | Discharge: 2024-04-18 | Disposition: A | Discharge status: Home / Self Care | Attending: Gastroenterology | Admitting: Gastroenterology

## 2024-04-18 ENCOUNTER — Encounter
Admission: RE | Disposition: A | Discharge status: Home / Self Care | Payer: Self-pay | Source: Home / Self Care | Attending: Gastroenterology

## 2024-04-18 DIAGNOSIS — Z6838 Body mass index (BMI) 38.0-38.9, adult: Secondary | ICD-10-CM | POA: Insufficient documentation

## 2024-04-18 DIAGNOSIS — Z87891 Personal history of nicotine dependence: Secondary | ICD-10-CM | POA: Diagnosis not present

## 2024-04-18 DIAGNOSIS — Z79899 Other long term (current) drug therapy: Secondary | ICD-10-CM | POA: Diagnosis not present

## 2024-04-18 DIAGNOSIS — E6689 Other obesity not elsewhere classified: Secondary | ICD-10-CM | POA: Insufficient documentation

## 2024-04-18 DIAGNOSIS — Z1211 Encounter for screening for malignant neoplasm of colon: Secondary | ICD-10-CM | POA: Diagnosis present

## 2024-04-18 DIAGNOSIS — G4730 Sleep apnea, unspecified: Secondary | ICD-10-CM | POA: Diagnosis not present

## 2024-04-18 DIAGNOSIS — K635 Polyp of colon: Secondary | ICD-10-CM | POA: Insufficient documentation

## 2024-04-18 DIAGNOSIS — K6389 Other specified diseases of intestine: Secondary | ICD-10-CM | POA: Diagnosis not present

## 2024-04-18 DIAGNOSIS — I10 Essential (primary) hypertension: Secondary | ICD-10-CM | POA: Insufficient documentation

## 2024-04-18 HISTORY — PX: POLYPECTOMY: SHX149

## 2024-04-18 HISTORY — PX: COLONOSCOPY: SHX5424

## 2024-04-18 SURGERY — COLONOSCOPY
Anesthesia: General

## 2024-04-18 MED ORDER — LIDOCAINE HCL (CARDIAC) PF 100 MG/5ML IV SOSY
PREFILLED_SYRINGE | INTRAVENOUS | Status: DC | PRN
  Administered 2024-04-18: 100 mg via INTRAVENOUS

## 2024-04-18 MED ORDER — SODIUM CHLORIDE 0.9 % IV SOLN
INTRAVENOUS | Status: DC
  Administered 2024-04-18: 20 mL/h via INTRAVENOUS

## 2024-04-18 MED ORDER — PROPOFOL 500 MG/50ML IV EMUL
INTRAVENOUS | Status: DC | PRN
  Administered 2024-04-18: 50 mg via INTRAVENOUS
  Administered 2024-04-18: 150 ug/kg/min via INTRAVENOUS

## 2024-04-18 NOTE — Interval H&P Note (Signed)
 History and Physical Interval Note: Preprocedure H&P from 04/18/24  was reviewed and there was no interval change after seeing and examining the patient.  Written consent was obtained from the patient after discussion of risks, benefits, and alternatives. Patient has consented to proceed with Colonoscopy with possible intervention   04/18/2024 10:30 AM  Jonathan Oconnor  has presented today for surgery, with the diagnosis of Screening for colon cancer (Z12.11).  The various methods of treatment have been discussed with the patient and family. After consideration of risks, benefits and other options for treatment, the patient has consented to  Procedure(s): COLONOSCOPY (N/A) as a surgical intervention.  The patient's history has been reviewed, patient examined, no change in status, stable for surgery.  I have reviewed the patient's chart and labs.  Questions were answered to the patient's satisfaction.     Elspeth Ozell Jungling

## 2024-04-18 NOTE — Anesthesia Postprocedure Evaluation (Signed)
 Anesthesia Post Note  Patient: Jonathan Oconnor  Procedure(s) Performed: COLONOSCOPY POLYPECTOMY, INTESTINE  Patient location during evaluation: PACU Anesthesia Type: General Level of consciousness: awake Pain management: satisfactory to patient Vital Signs Assessment: post-procedure vital signs reviewed and stable Respiratory status: spontaneous breathing Cardiovascular status: stable Anesthetic complications: no   There were no known notable events for this encounter.   Last Vitals:  Vitals:   04/18/24 1132 04/18/24 1139  BP: (!) 143/90 (!) 159/97  Pulse: 65   Resp: 14 12  Temp: (!) 36.1 C (!) 36.1 C  SpO2: 100% 100%    Last Pain:  Vitals:   04/18/24 1139  TempSrc:   PainSc: 0-No pain                 VAN STAVEREN,Januel Doolan

## 2024-04-18 NOTE — Anesthesia Preprocedure Evaluation (Signed)
 Anesthesia Evaluation  Patient identified by MRN, date of birth, ID band Patient awake    Reviewed: Allergy & Precautions, NPO status , Patient's Chart, lab work & pertinent test results  Airway Mallampati: II  TM Distance: >3 FB Neck ROM: Full    Dental  (+) Teeth Intact   Pulmonary neg pulmonary ROS, sleep apnea and Continuous Positive Airway Pressure Ventilation , former smoker   Pulmonary exam normal breath sounds clear to auscultation       Cardiovascular Exercise Tolerance: Good hypertension, Pt. on medications negative cardio ROS Normal cardiovascular exam Rhythm:Regular Rate:Normal     Neuro/Psych negative neurological ROS  negative psych ROS   GI/Hepatic negative GI ROS, Neg liver ROS,,,  Endo/Other  negative endocrine ROS  Class 4 obesity  Renal/GU negative Renal ROS  negative genitourinary   Musculoskeletal   Abdominal  (+) + obese  Peds negative pediatric ROS (+)  Hematology negative hematology ROS (+)   Anesthesia Other Findings Past Medical History: No date: Allergic rhinitis No date: Chicken pox 03/27/04: Epididymitis     Comment:  ED [facility] No date: Hypertension  Past Surgical History: 1982: KNEE SURGERY; Left     Comment:  surgery for ? infection in his knee  BMI    Body Mass Index: 38.82 kg/m      Reproductive/Obstetrics negative OB ROS                              Anesthesia Physical Anesthesia Plan  ASA: 3  Anesthesia Plan: General   Post-op Pain Management:    Induction: Intravenous  PONV Risk Score and Plan: Propofol  infusion and TIVA  Airway Management Planned: Natural Airway and Nasal Cannula  Additional Equipment:   Intra-op Plan:   Post-operative Plan:   Informed Consent: I have reviewed the patients History and Physical, chart, labs and discussed the procedure including the risks, benefits and alternatives for the proposed  anesthesia with the patient or authorized representative who has indicated his/her understanding and acceptance.     Dental Advisory Given  Plan Discussed with: CRNA  Anesthesia Plan Comments:         Anesthesia Quick Evaluation

## 2024-04-18 NOTE — Op Note (Signed)
 [facility] Patient Name: Jonathan Oconnor Procedure Date: 04/18/2024 10:23 AM MRN: [id number] Account #: [id number] Date of Birth: Aug 13, 1976 Admit Type: Outpatient Age: 48 Room: [facility] ENDO ROOM 1 Gender: Male Note Status: Finalized Instrument Name: Colon Scope [id number] Procedure:             Colonoscopy Indications:           Screening for colorectal malignant neoplasm Providers:             Elspeth Ozell Jungling DO, DO Referring MD:          Charlie CHANETA Denise (Referring MD) Medicines:             Monitored Anesthesia Care Complications:         No immediate complications. Estimated blood loss:                         Minimal. Procedure:             Pre-Anesthesia Assessment:                        - Prior to the procedure, a History and Physical was                         performed, and patient medications and allergies were                         reviewed. The patient is competent. The risks and                         benefits of the procedure and the sedation options and                         risks were discussed with the patient. All questions                         were answered and informed consent was obtained.                         Patient identification and proposed procedure were                         verified by the physician, the nurse, the anesthetist                         and the technician in the endoscopy suite. Mental                         Status Examination: alert and oriented. Airway                         Examination: normal oropharyngeal airway and neck                         mobility. Respiratory Examination: clear to                         auscultation. CV Examination: RRR, no murmurs, no S3  or S4. Prophylactic Antibiotics: The patient does not                         require prophylactic antibiotics. Prior                         Anticoagulants: The patient has taken no anticoagulant                          or antiplatelet agents. ASA Grade Assessment: III - A                         patient with severe systemic disease. After reviewing                         the risks and benefits, the patient was deemed in                         satisfactory condition to undergo the procedure. The                         anesthesia plan was to use monitored anesthesia care                         (MAC). Immediately prior to administration of                         medications, the patient was re-assessed for adequacy                         to receive sedatives. The heart rate, respiratory                         rate, oxygen saturations, blood pressure, adequacy of                         pulmonary ventilation, and response to care were                         monitored throughout the procedure. The physical                         status of the patient was re-assessed after the                         procedure.                        After obtaining informed consent, the colonoscope was                         passed under direct vision. Throughout the procedure,                         the patient's blood pressure, pulse, and oxygen                         saturations were monitored continuously. The  Colonoscope was introduced through the anus and                         advanced to the the cecum, identified by appendiceal                         orifice and ileocecal valve. The colonoscopy was                         performed without difficulty. The patient tolerated                         the procedure well. The quality of the bowel                         preparation was evaluated using the BBPS [facility] Bowel                         Preparation Scale) with scores of: Right Colon = 3,                         Transverse Colon = 3 and Left Colon = 3 (entire mucosa                         seen well with no residual staining, small fragments                          of stool or opaque liquid). The total BBPS score                         equals 9. The ileocecal valve, appendiceal orifice,                         and rectum were photographed. Findings:      The perianal and digital rectal examinations were normal. Pertinent       negatives include normal sphincter tone.      Scattered mild inflammation characterized by erosions was found in the       descending colon, in the transverse colon and in the ascending colon.       Biopsies were taken with a cold forceps for histology. Estimated blood       loss was minimal.      The exam was otherwise without abnormality on direct and retroflexion       views. Impression:            - Scattered mild inflammation was found in the                         descending colon, in the transverse colon and in the                         ascending colon. Biopsied.                        - The examination was otherwise normal on direct and                         retroflexion  views. Recommendation:        - Patient has a contact number available for                         emergencies. The signs and symptoms of potential                         delayed complications were discussed with the patient.                         Return to normal activities tomorrow. Written                         discharge instructions were provided to the patient.                        - Discharge patient to home.                        - Resume previous diet.                        - Continue present medications.                        - Await pathology results.                        - Repeat colonoscopy for surveillance based on                         pathology results.                        - Return to referring physician as previously                         scheduled.                        - The findings and recommendations were discussed with                         the patient. Procedure Code(s):     --- Professional  ---                        [phone], Colonoscopy, flexible; with biopsy, single or                         multiple Diagnosis Code(s):     --- Professional ---                        Z12.11, Encounter for screening for malignant neoplasm                         of colon                        K52.9, Noninfective gastroenteritis and colitis,                         unspecified  CPT copyright 2022 [employer]. All rights reserved. The codes documented in this report are preliminary and upon coder review may  be revised to meet current compliance requirements. Attending Participation:      I personally performed the entire procedure. Elspeth Jungling, DO Elspeth Ozell Jungling DO, DO 04/18/2024 11:10:54 AM This report has been signed electronically. Number of Addenda: 0 Note Initiated On: 04/18/2024 10:23 AM Scope Withdrawal Time: 0 hours 14 minutes 32 seconds  Total Procedure Duration: 0 hours 21 minutes 59 seconds  Estimated Blood Loss:  Estimated blood loss was minimal.      [facility]

## 2024-04-18 NOTE — Transfer of Care (Signed)
 Immediate Anesthesia Transfer of Care Note  Patient: Jonathan Oconnor  Procedure(s) Performed: COLONOSCOPY POLYPECTOMY, INTESTINE  Patient Location: PACU  Anesthesia Type:General  Level of Consciousness: drowsy and patient cooperative  Airway & Oxygen Therapy: Patient Spontanous Breathing  Post-op Assessment: Report given to RN and Post -op Vital signs reviewed and stable  Post vital signs: stable  Last Vitals:  Vitals Value Taken Time  BP 118/71 04/18/24 11:13  Temp 36.1 C 04/18/24 11:13  Pulse 69 04/18/24 11:13  Resp 12 04/18/24 11:13  SpO2 98 % 04/18/24 11:13    Last Pain:  Vitals:   04/18/24 1010  TempSrc: Temporal  PainSc: 1          Complications: No notable events documented.

## 2024-04-23 LAB — SURGICAL PATHOLOGY

## 2025-01-02 ENCOUNTER — Encounter
